# Patient Record
Sex: Male | Born: 1965 | Race: White | Hispanic: No | Marital: Married | Smoking: Never smoker
Health system: Southern US, Community
[De-identification: ages and names within clinical notes are randomized; demographics above are authoritative.]

## PROBLEM LIST (undated history)

## (undated) DIAGNOSIS — Z973 Presence of spectacles and contact lenses: Secondary | ICD-10-CM

## (undated) DIAGNOSIS — K5792 Diverticulitis of intestine, part unspecified, without perforation or abscess without bleeding: Secondary | ICD-10-CM

## (undated) DIAGNOSIS — H269 Unspecified cataract: Secondary | ICD-10-CM

## (undated) DIAGNOSIS — C801 Malignant (primary) neoplasm, unspecified: Secondary | ICD-10-CM

## (undated) DIAGNOSIS — K219 Gastro-esophageal reflux disease without esophagitis: Secondary | ICD-10-CM

## (undated) HISTORY — DX: Unspecified cataract: H26.9

## (undated) HISTORY — PX: TENDON REPAIR: SHX5111

## (undated) HISTORY — PX: HERNIA REPAIR: SHX51

---
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
## 1988-11-09 HISTORY — PX: TONSILLECTOMY AND ADENOIDECTOMY: SUR1326

## 2002-11-22 ENCOUNTER — Encounter: Admission: RE | Admit: 2002-11-22 | Discharge: 2002-11-22 | Payer: Self-pay | Admitting: Family Medicine

## 2002-11-22 ENCOUNTER — Encounter: Payer: Self-pay | Admitting: Family Medicine

## 2011-11-10 DIAGNOSIS — C801 Malignant (primary) neoplasm, unspecified: Secondary | ICD-10-CM

## 2011-11-10 HISTORY — DX: Malignant (primary) neoplasm, unspecified: C80.1

## 2011-11-10 HISTORY — PX: TESTICLE REMOVAL: SHX68

## 2013-10-26 DIAGNOSIS — E785 Hyperlipidemia, unspecified: Secondary | ICD-10-CM | POA: Insufficient documentation

## 2013-11-10 ENCOUNTER — Other Ambulatory Visit: Payer: Self-pay | Admitting: Urology

## 2013-11-13 ENCOUNTER — Encounter (HOSPITAL_BASED_OUTPATIENT_CLINIC_OR_DEPARTMENT_OTHER)
Admission: RE | Disposition: A | Discharge status: Home / Self Care | Payer: Self-pay | Source: Ambulatory Visit | Attending: Urology

## 2013-11-13 ENCOUNTER — Encounter (HOSPITAL_BASED_OUTPATIENT_CLINIC_OR_DEPARTMENT_OTHER): Payer: Self-pay | Admitting: *Deleted

## 2013-11-13 ENCOUNTER — Ambulatory Visit (HOSPITAL_BASED_OUTPATIENT_CLINIC_OR_DEPARTMENT_OTHER): Payer: Managed Care, Other (non HMO) | Admitting: Anesthesiology

## 2013-11-13 ENCOUNTER — Encounter (HOSPITAL_BASED_OUTPATIENT_CLINIC_OR_DEPARTMENT_OTHER): Payer: Managed Care, Other (non HMO) | Admitting: Anesthesiology

## 2013-11-13 ENCOUNTER — Ambulatory Visit (HOSPITAL_BASED_OUTPATIENT_CLINIC_OR_DEPARTMENT_OTHER)
Admission: RE | Admit: 2013-11-13 | Discharge: 2013-11-13 | Disposition: A | Discharge status: Home / Self Care | Payer: Managed Care, Other (non HMO) | Source: Ambulatory Visit | Attending: Urology | Admitting: Urology

## 2013-11-13 DIAGNOSIS — N508 Other specified disorders of male genital organs: Secondary | ICD-10-CM | POA: Insufficient documentation

## 2013-11-13 DIAGNOSIS — N5089 Other specified disorders of the male genital organs: Secondary | ICD-10-CM

## 2013-11-13 DIAGNOSIS — D409 Neoplasm of uncertain behavior of male genital organ, unspecified: Secondary | ICD-10-CM | POA: Insufficient documentation

## 2013-11-13 DIAGNOSIS — Z9852 Vasectomy status: Secondary | ICD-10-CM | POA: Insufficient documentation

## 2013-11-13 DIAGNOSIS — R05 Cough: Secondary | ICD-10-CM | POA: Insufficient documentation

## 2013-11-13 DIAGNOSIS — R059 Cough, unspecified: Secondary | ICD-10-CM | POA: Insufficient documentation

## 2013-11-13 HISTORY — DX: Presence of spectacles and contact lenses: Z97.3

## 2013-11-13 HISTORY — PX: ORCHIECTOMY: SHX2116

## 2013-11-13 LAB — POCT HEMOGLOBIN-HEMACUE: Hemoglobin: 17.2 g/dL — ABNORMAL HIGH (ref 13.0–17.0)

## 2013-11-13 SURGERY — ORCHIECTOMY
Anesthesia: General | Site: Scrotum | Laterality: Left

## 2013-11-13 MED ORDER — FENTANYL CITRATE 0.05 MG/ML IJ SOLN
INTRAMUSCULAR | Status: AC
  Filled 2013-11-13: qty 6

## 2013-11-13 MED ORDER — HYDROMORPHONE HCL PF 1 MG/ML IJ SOLN
0.2500 mg | INTRAMUSCULAR | Status: DC | PRN
  Administered 2013-11-13 (×2): 0.5 mg via INTRAVENOUS
  Filled 2013-11-13: qty 1

## 2013-11-13 MED ORDER — PROMETHAZINE HCL 25 MG/ML IJ SOLN
6.2500 mg | INTRAMUSCULAR | Status: DC | PRN
  Filled 2013-11-13: qty 1

## 2013-11-13 MED ORDER — BUPIVACAINE HCL (PF) 0.25 % IJ SOLN
INTRAMUSCULAR | Status: DC | PRN
  Administered 2013-11-13: 20 mL

## 2013-11-13 MED ORDER — HYDROMORPHONE HCL PF 1 MG/ML IJ SOLN
INTRAMUSCULAR | Status: AC
  Filled 2013-11-13: qty 1

## 2013-11-13 MED ORDER — LIDOCAINE HCL (CARDIAC) 20 MG/ML IV SOLN
INTRAVENOUS | Status: DC | PRN
Start: 2013-11-13 — End: 2013-11-13
  Administered 2013-11-13: 60 mg via INTRAVENOUS

## 2013-11-13 MED ORDER — PROPOFOL 10 MG/ML IV BOLUS
INTRAVENOUS | Status: DC | PRN
  Administered 2013-11-13: 200 mg via INTRAVENOUS

## 2013-11-13 MED ORDER — OXYCODONE HCL 5 MG/5ML PO SOLN
5.0000 mg | Freq: Once | ORAL | Status: DC | PRN
  Filled 2013-11-13: qty 5

## 2013-11-13 MED ORDER — OXYCODONE-ACETAMINOPHEN 5-325 MG PO TABS: 1.0000 | ORAL_TABLET | ORAL | Status: DC | PRN

## 2013-11-13 MED ORDER — MIDAZOLAM HCL 2 MG/2ML IJ SOLN
INTRAMUSCULAR | Status: AC
  Filled 2013-11-13: qty 2

## 2013-11-13 MED ORDER — MIDAZOLAM HCL 5 MG/5ML IJ SOLN
INTRAMUSCULAR | Status: DC | PRN
  Administered 2013-11-13: 2 mg via INTRAVENOUS

## 2013-11-13 MED ORDER — OXYCODONE-ACETAMINOPHEN 5-325 MG PO TABS
ORAL_TABLET | ORAL | Status: AC
  Filled 2013-11-13: qty 1

## 2013-11-13 MED ORDER — SENNOSIDES-DOCUSATE SODIUM 8.6-50 MG PO TABS: 1.0000 | ORAL_TABLET | Freq: Two times a day (BID) | ORAL | Status: DC

## 2013-11-13 MED ORDER — OXYCODONE HCL 5 MG PO TABS
5.0000 mg | ORAL_TABLET | Freq: Once | ORAL | Status: DC | PRN
  Filled 2013-11-13: qty 1

## 2013-11-13 MED ORDER — CEFAZOLIN SODIUM-DEXTROSE 2-3 GM-% IV SOLR
2.0000 g | INTRAVENOUS | Status: AC
  Administered 2013-11-13: 2 g via INTRAVENOUS
  Filled 2013-11-13: qty 50

## 2013-11-13 MED ORDER — DEXAMETHASONE SODIUM PHOSPHATE 4 MG/ML IJ SOLN
INTRAMUSCULAR | Status: DC | PRN
  Administered 2013-11-13: 8 mg via INTRAVENOUS

## 2013-11-13 MED ORDER — FENTANYL CITRATE 0.05 MG/ML IJ SOLN
INTRAMUSCULAR | Status: DC | PRN
  Administered 2013-11-13: 50 ug via INTRAVENOUS
  Administered 2013-11-13: 25 ug via INTRAVENOUS
  Administered 2013-11-13: 50 ug via INTRAVENOUS
  Administered 2013-11-13 (×3): 25 ug via INTRAVENOUS

## 2013-11-13 MED ORDER — ONDANSETRON HCL 4 MG/2ML IJ SOLN
INTRAMUSCULAR | Status: DC | PRN
  Administered 2013-11-13: 4 mg via INTRAVENOUS

## 2013-11-13 MED ORDER — LACTATED RINGERS IV SOLN
INTRAVENOUS | Status: DC
  Administered 2013-11-13 (×3): via INTRAVENOUS
  Filled 2013-11-13: qty 1000

## 2013-11-13 MED ORDER — OXYCODONE-ACETAMINOPHEN 5-325 MG PO TABS
1.0000 | ORAL_TABLET | ORAL | Status: DC | PRN
  Administered 2013-11-13: 1 via ORAL
  Filled 2013-11-13: qty 2

## 2013-11-13 MED ORDER — MEPERIDINE HCL 25 MG/ML IJ SOLN
6.2500 mg | INTRAMUSCULAR | Status: DC | PRN
  Filled 2013-11-13: qty 1

## 2013-11-13 MED ORDER — CEPHALEXIN 500 MG PO CAPS: 500.0000 mg | ORAL_CAPSULE | Freq: Three times a day (TID) | ORAL | Status: DC

## 2013-11-13 SURGICAL SUPPLY — 50 items
BANDAGE GAUZE ELAST BULKY 4 IN (GAUZE/BANDAGES/DRESSINGS) ×3 IMPLANT
BLADE HEX COATED 2.75 (ELECTRODE) ×6 IMPLANT
BLADE SURG 10 STRL SS (BLADE) ×3 IMPLANT
BLADE SURG 15 STRL LF DISP TIS (BLADE) ×1 IMPLANT
BLADE SURG 15 STRL SS (BLADE) ×2
BNDG GAUZE ELAST 4 BULKY (GAUZE/BANDAGES/DRESSINGS) ×3 IMPLANT
CANISTER SUCT LVC 12 LTR MEDI- (MISCELLANEOUS) IMPLANT
CANISTER SUCTION 2500CC (MISCELLANEOUS) ×3 IMPLANT
CLOTH BEACON ORANGE TIMEOUT ST (SAFETY) IMPLANT
COVER MAYO STAND STRL (DRAPES) ×3 IMPLANT
COVER TABLE BACK 60X90 (DRAPES) ×3 IMPLANT
DERMABOND ADVANCED (GAUZE/BANDAGES/DRESSINGS) ×2
DERMABOND ADVANCED .7 DNX12 (GAUZE/BANDAGES/DRESSINGS) ×1 IMPLANT
DISSECTOR ROUND CHERRY 3/8 STR (MISCELLANEOUS) ×3 IMPLANT
DRAIN PENROSE 18X1/4 LTX STRL (WOUND CARE) ×3 IMPLANT
DRAPE PED LAPAROTOMY (DRAPES) ×3 IMPLANT
ELECT REM PT RETURN 9FT ADLT (ELECTROSURGICAL) ×3
ELECTRODE REM PT RTRN 9FT ADLT (ELECTROSURGICAL) ×1 IMPLANT
GLOVE BIO SURGEON STRL SZ7.5 (GLOVE) ×3 IMPLANT
GLOVE BIOGEL PI IND STRL 7.5 (GLOVE) ×2 IMPLANT
GLOVE BIOGEL PI INDICATOR 7.5 (GLOVE) ×4
GLOVE ECLIPSE 7.5 STRL STRAW (GLOVE) ×3 IMPLANT
GLOVE INDICATOR 7.5 STRL GRN (GLOVE) ×6 IMPLANT
GOWN PREVENTION PLUS LG XLONG (DISPOSABLE) IMPLANT
GOWN PREVENTION PLUS XLARGE (GOWN DISPOSABLE) ×6 IMPLANT
NEEDLE HYPO 22GX1.5 SAFETY (NEEDLE) ×3 IMPLANT
NS IRRIG 500ML POUR BTL (IV SOLUTION) ×3 IMPLANT
PACK BASIN DAY SURGERY FS (CUSTOM PROCEDURE TRAY) ×3 IMPLANT
PENCIL BUTTON HOLSTER BLD 10FT (ELECTRODE) ×3 IMPLANT
SPONGE LAP 4X18 X RAY DECT (DISPOSABLE) IMPLANT
SUPPORT SCROTAL LG STRP (MISCELLANEOUS) ×2 IMPLANT
SUPPORTER ATHLETIC LG (MISCELLANEOUS) ×1
SUT CHROMIC 2 0 SH (SUTURE) ×3 IMPLANT
SUT CHROMIC 3 0 SH 27 (SUTURE) IMPLANT
SUT MNCRL AB 4-0 PS2 18 (SUTURE) ×3 IMPLANT
SUT SILK 0 SH 30 (SUTURE) ×3 IMPLANT
SUT SILK 0 TIES 10X30 (SUTURE) ×3 IMPLANT
SUT VIC AB 2-0 SH 27 (SUTURE) ×4
SUT VIC AB 2-0 SH 27XBRD (SUTURE) ×2 IMPLANT
SUT VIC AB 2-0 UR5 27 (SUTURE) IMPLANT
SUT VICRYL 0 TIES 12 18 (SUTURE) IMPLANT
SYR BULB IRRIGATION 50ML (SYRINGE) ×3 IMPLANT
SYR CONTROL 10ML LL (SYRINGE) ×3 IMPLANT
TOWEL OR 17X24 6PK STRL BLUE (TOWEL DISPOSABLE) ×9 IMPLANT
TRAY DSU PREP LF (CUSTOM PROCEDURE TRAY) ×3 IMPLANT
TUBE CONNECTING 12'X1/4 (SUCTIONS) ×1
TUBE CONNECTING 12X1/4 (SUCTIONS) ×2 IMPLANT
TUBING SUCTION BULK 100 FT (MISCELLANEOUS) IMPLANT
WATER STERILE IRR 500ML POUR (IV SOLUTION) IMPLANT
YANKAUER SUCT BULB TIP NO VENT (SUCTIONS) ×3 IMPLANT

## 2013-11-13 NOTE — Transfer of Care (Signed)
Immediate Anesthesia Transfer of Care Note  Patient: Roberto Ruiz  Procedure(s) Performed: Procedure(s) (LRB): LEFT RADICAL INGUINAL ORCHIECTOMY (Left)  Patient Location: PACU  Anesthesia Type: General  Level of Consciousness: awake, oriented, sedated and patient cooperative  Airway & Oxygen Therapy: Patient Spontanous Breathing and Patient connected to face mask oxygen  Post-op Assessment: Report given to PACU RN and Post -op Vital signs reviewed and stable  Post vital signs: Reviewed and stable  Complications: No apparent anesthesia complications

## 2013-11-13 NOTE — Op Note (Signed)
Urology Operative Report  Date of Procedure: 11/13/13  Surgeon: Rolan Bucco, MD Assistant:  None  Preoperative Diagnosis: Left testis mass Postoperative Diagnosis:  Same  Procedure(s): Left inguinal radical orchiectomy  Estimated blood loss: Minimal  Specimen: Left testis and spermatic cord.  Drains: None  Complications: None  Findings: Solid left testis mass.  History of present illness: Patient presents today for left inguinal radical orchiectomy for a solid mass discovered on routine physical exam of the left testicle.   Procedure in detail: After informed consent was obtained, the patient was taken to the operating room. They were placed in the supine position. SCDs were turned on and in place. IV antibiotics were infused, and general anesthesia was induced. A timeout was performed in which the correct patient, surgical site, and procedure were identified and agreed upon by the team.  The hair was removed from the genitals and lower abdomen and then these areas were prepped and draped in the usual sterile fashion.  I was able to place my finger along the spermatic cord and palpate the external ring of the left inguinal canal. I made a mark on the skin with a marking pen were this was located. I then made a horizontal incision proximal and 2 cm superior to this on the lower left abdomen. I dissected down through the layers of the abdominal wall using Bovie electrocautery to maintain good hemostasis until encountered external aponeurosis of the external oblique muscle. I was then able to make an incision running along the fibers of the aponeurosis. I then pushed close Metzenbaum scissors and the this towards the external ring and then again laterally and medially to push away the ilioinguinal nerve. I then incised down to the external ring. I was then able to identify the ileal inguinal nerve and displaced this tumor was out of the field of dissection to avoid injury.  I then  was able to place my fingers and thumb circumferentially around the left spermatic cord at the level of the pubic tubercle. I then placed a Penrose drain doubly wrapped around the spermatic cord and used this as a tourniquet. After this I was able to sweep away cremasteric fibers which were attaching the cord to the aponeurosis. After this was done I was able to deliver the testicle into the incision by pushing superiorly on the left hemiscrotum. I then dissected the testicle away from the scrotal wall. This took approximately twice as long as normal likely due to the fact that he had a previous vasectomy. There was an area on the scrotum where there was a large amount of adhesions. I was careful not to buttonhole the skin of the scrotum. Once the testicle was free I was able to inspect this and determined that there was no hernia sac or hernia present. I then separated the left spermatic cord into 4 separate bundles; one of these was the vas deferens. These were ligated with 0 silk ties and 2-0 silk sutures at the level of the inguinal canal. After this was done with each bundle, there was good hemostasis. I observed the left spermatic cord and there was no bleeding. There was not enough stump left of the spermatic cord to provide an adequate left spermatic cord nerve block and therefore this was not attempted. I left several the silk sutures long straight be easily found if intra-abdominal surgery was needed in the future. I then split the left spermatic cord stump into the inguinal canal.  I then closed aponeurosis of  the inguinal canal with interrupted 2-0 Vicryl sutures making sure to avoid injury to the ilioinguinal nerve. After this was done I turned to the scrotum inside out and inspected this for hemostasis. There was good hemostasis. I then irrigated with sterile normal saline.  I then closed Camper's and Scarpa's fascia with running and interrupted 2-0 Vicryl sutures. I then irrigated the wound again,  and injected 20 cc of quarter percent plain Marcaine around the incision. I then closed the skin with running 4-0 Monocryl suture and covered this with Dermabond. Scrotal support was placed on the scrotum, and the testicle and spermatic cord were sent for permanent pathology.  Anesthesia was reversed, he was taken to the [facility] in stable condition.  All counts were correct at the end of the case.

## 2013-11-13 NOTE — H&P (Signed)
Urology History and Physical Exam  CC: Left testis tumor  HPI:  48 year old male presents today for a left testis mass.  This was discovered during routine physical examination.  He had not noticed this previously.  It was not present one year ago on physical exam.  This is not associated with testis pain.  He states that 2 months ago he had an injury to the left testis.  Physical exam revealed a very firm mass in the left testis which was nontender to palpation.  This was 3 cm in size.  Concerning for testicular tumor.  Positive previous history of bilateral vasectomy.  Scrotal ultrasound 10/26/13 revealed a hypoechoic testis mass which was 2.7 cm in size with substantial blood flow.  This was concerning for tumor.  We discussed management options and I recommended left inguinal radical orchiectomy with left spermatic cord nerve block.  We discussed the risks, benefits, alternatives, likelihood of achieving goals, and side effects.  He had staging with CT of the chest abdomen and pelvis which was negative.  Preoperative blood tests reveal a normal AFP and LDH; beta hCG is pending at this time.  Preoperative testosterone was noted to be low at 275.  These results were provided to the patient.  He deferred placement of testicular prosthesis.  PMH: No past medical history on file.  PSH: No past surgical history on file.  Allergies: Allergies not on file  Medications: No prescriptions prior to admission     Social History: History   Social History  . Marital Status: Married    Spouse Name: N/A    Number of Children: N/A  . Years of Education: N/A   Occupational History  . Not on file.   Social History Main Topics  . Smoking status: Not on file  . Smokeless tobacco: Not on file  . Alcohol Use: Not on file  . Drug Use: Not on file  . Sexual Activity: Not on file   Other Topics Concern  . Not on file   Social History Narrative  . No narrative on file    Family History: No  family history on file.  Review of Systems: Positive: Cough. Negative: Fever, SOB, or chest pain.  A further 10 point review of systems was negative except what is listed in the HPI.  Physical Exam: Filed Vitals:   11/13/13 1101  BP: 139/88  Pulse: 77  Temp: 97.8 F (36.6 C)  Resp: 14    General: No acute distress.  Awake. Head:  Normocephalic.  Atraumatic. ENT:  EOMI.  Mucous membranes moist Neck:  Supple.  No lymphadenopathy. CV:  S1 present. S2 present. Regular rate. Pulmonary: Equal effort bilaterally.  Clear to auscultation bilaterally. Abdomen: Soft.  Non- tender to palpation. Skin:  Normal turgor.  No visible rash. Extremity: No gross deformity of bilateral upper extremities.  No gross deformity of    bilateral lower extremities. Neurologic: Alert. Appropriate mood.  GU:  Solid left testis mass. Right testis negative for mass.    Studies:  No results found for this basename: HGB, WBC, PLT,  in the last 72 hours  No results found for this basename: NA, K, CL, CO2, BUN, CREATININE, CALCIUM, MAGNESIUM, GFRNONAA, GFRAA,  in the last 72 hours   No results found for this basename: PT, INR, APTT,  in the last 72 hours   No components found with this basename: ABG,     Assessment:  Left testis tumor.  Plan: To OR for left inguinal radical  orchiectomy with left spermatic cord nerve block.

## 2013-11-13 NOTE — Discharge Instructions (Signed)
DISCHARGE INSTRUCTIONS FOR SCROTAL SURGERY  MEDICATIONS:  1. DO NOT RESUME YOUR ASPIRIN, IBUPROFEN, WARFARIN, OR OTHER BLOOD THINNER FOR 1 WEEK.  2. Resume all your other meds from home.  ACTIVITY 1. No heavy lifting >10 pounds for 4 weeks 2. No sexual activity for 4 weeks 3. No strenuous activity for 4 weeks 4. No driving while on narcotic pain medications 5. Drink plenty of water 6. Continue to walk at home - you can still get blood clots when you are at  home, so keep active, but don't over do it. 7. No straddle activity (such as riding a bike or a horse)for 4 weeks.  BATHING 1. You can shower 48 hours after surgery. Do not take baths or submerge your wound underwater.  SIGNS/SYMPTOMS TO CALL: 1. Please call us if you have a fever greater than 101.5, uncontrolled  nausea/vomiting, uncontrolled pain, dizziness, unable to urinate, enlarging or swelling scrotum, wound drainage, chest pain, shortness of breath, leg swelling, leg pain, or any other concerns  or questions.  You can reach [place] at [phone].    Post Anesthesia Home Care Instructions  Activity: Get plenty of rest for the remainder of the day. A responsible adult should stay with you for 24 hours following the procedure.  For the next 24 hours, DO NOT: -Drive a car -Paediatric nurse -Drink alcoholic beverages -Take any medication unless instructed by your physician -Make any legal decisions or sign important papers.  Meals: Start with liquid foods such as gelatin or soup. Progress to regular foods as tolerated. Avoid greasy, spicy, heavy foods. If nausea and/or vomiting occur, drink only clear liquids until the nausea and/or vomiting subsides. Call your physician if vomiting continues.  Special Instructions/Symptoms: Your throat may feel dry or sore from the anesthesia or the breathing tube placed in your throat during surgery. If this causes discomfort, gargle with warm salt water. The discomfort should  disappear within 24 hours.

## 2013-11-13 NOTE — Anesthesia Procedure Notes (Signed)
Procedure Name: LMA Insertion Date/Time: 11/13/2013 1:15 PM Performed by: Denna Haggard D Pre-anesthesia Checklist: Patient identified, Emergency Drugs available, Suction available and Patient being monitored Patient Re-evaluated:Patient Re-evaluated prior to inductionOxygen Delivery Method: Circle System Utilized Preoxygenation: Pre-oxygenation with 100% oxygen Intubation Type: IV induction Ventilation: Mask ventilation without difficulty LMA: LMA inserted LMA Size: 4.0 Number of attempts: 1 Airway Equipment and Method: bite block Placement Confirmation: positive ETCO2 Tube secured with: Tape Dental Injury: Teeth and Oropharynx as per pre-operative assessment

## 2013-11-13 NOTE — Anesthesia Postprocedure Evaluation (Signed)
Anesthesia Post Note  Patient: Roberto Ruiz  Procedure(s) Performed: Procedure(s) (LRB): LEFT RADICAL INGUINAL ORCHIECTOMY (Left)  Anesthesia type: General  Patient location: PACU  Post pain: Pain level controlled  Post assessment: Post-op Vital signs reviewed  Last Vitals: BP 115/71  Pulse 67  Temp(Src) 36.6 C (Oral)  Resp 10  Ht 5\' 9"  (1.753 m)  Wt 223 lb 8 oz (101.379 kg)  BMI 32.99 kg/m2  SpO2 96%  Post vital signs: Reviewed  Level of consciousness: sedated  Complications: No apparent anesthesia complications

## 2013-11-13 NOTE — Anesthesia Preprocedure Evaluation (Signed)
Anesthesia Evaluation  Patient identified by MRN, date of birth, ID band Patient awake    Reviewed: Allergy & Precautions, H&P , NPO status , Patient's Chart, lab work & pertinent test results  History of Anesthesia Complications Negative for: history of anesthetic complications  Airway Mallampati: II TM Distance: >3 FB Neck ROM: full    Dental no notable dental hx. (+) Teeth Intact   Pulmonary neg pulmonary ROS,  breath sounds clear to auscultation  Pulmonary exam normal       Cardiovascular negative cardio ROS  Rhythm:regular Rate:Normal     Neuro/Psych negative neurological ROS  negative psych ROS   GI/Hepatic negative GI ROS, Neg liver ROS,   Endo/Other  negative endocrine ROS  Renal/GU negative Renal ROS  negative genitourinary   Musculoskeletal   Abdominal Normal abdominal exam  (+)   Peds  Hematology negative hematology ROS (+)   Anesthesia Other Findings   Reproductive/Obstetrics                           Anesthesia Physical Anesthesia Plan  ASA: I  Anesthesia Plan: General   Post-op Pain Management:    Induction:   Airway Management Planned:   Additional Equipment:   Intra-op Plan:   Post-operative Plan:   Informed Consent: I have reviewed the patients History and Physical, chart, labs and discussed the procedure including the risks, benefits and alternatives for the proposed anesthesia with the patient or authorized representative who has indicated his/her understanding and acceptance.   Dental Advisory Given  Plan Discussed with:   Anesthesia Plan Comments:         Anesthesia Quick Evaluation

## 2013-11-14 ENCOUNTER — Encounter (HOSPITAL_BASED_OUTPATIENT_CLINIC_OR_DEPARTMENT_OTHER): Payer: Self-pay | Admitting: Urology

## 2013-11-20 ENCOUNTER — Telehealth: Payer: Self-pay | Admitting: Oncology

## 2013-11-20 NOTE — Telephone Encounter (Signed)
S/w pt and gve np appt 01/15 @ 1:30 w/Dr. Alen Blew Referring Dr. Jasmine December Dx- Testicular Ca Welcome packet mailed.

## 2013-11-20 NOTE — Telephone Encounter (Signed)
C/D 11/20/13 for appt. 11/23/13

## 2013-11-20 NOTE — Telephone Encounter (Signed)
LVOM FOR PT TO RETURN CALL IN RE TO REFERRAL.  °

## 2013-11-21 ENCOUNTER — Other Ambulatory Visit: Payer: Self-pay | Admitting: Oncology

## 2013-11-21 DIAGNOSIS — C6290 Malignant neoplasm of unspecified testis, unspecified whether descended or undescended: Secondary | ICD-10-CM

## 2013-11-23 ENCOUNTER — Other Ambulatory Visit (HOSPITAL_BASED_OUTPATIENT_CLINIC_OR_DEPARTMENT_OTHER): Payer: Managed Care, Other (non HMO)

## 2013-11-23 ENCOUNTER — Encounter: Payer: Self-pay | Admitting: Oncology

## 2013-11-23 ENCOUNTER — Ambulatory Visit (HOSPITAL_BASED_OUTPATIENT_CLINIC_OR_DEPARTMENT_OTHER): Payer: Managed Care, Other (non HMO) | Admitting: Oncology

## 2013-11-23 ENCOUNTER — Telehealth: Payer: Self-pay | Admitting: Oncology

## 2013-11-23 ENCOUNTER — Ambulatory Visit: Payer: Managed Care, Other (non HMO)

## 2013-11-23 VITALS — BP 128/85 | HR 64 | Temp 98.1°F | Resp 18 | Ht 69.0 in | Wt 225.7 lb

## 2013-11-23 DIAGNOSIS — Z8547 Personal history of malignant neoplasm of testis: Secondary | ICD-10-CM | POA: Insufficient documentation

## 2013-11-23 DIAGNOSIS — C6290 Malignant neoplasm of unspecified testis, unspecified whether descended or undescended: Secondary | ICD-10-CM

## 2013-11-23 LAB — COMPREHENSIVE METABOLIC PANEL (CC13)
ALT: 31 U/L (ref 0–55)
ANION GAP: 12 meq/L — AB (ref 3–11)
AST: 21 U/L (ref 5–34)
Albumin: 4.2 g/dL (ref 3.5–5.0)
Alkaline Phosphatase: 74 U/L (ref 40–150)
BUN: 17.2 mg/dL (ref 7.0–26.0)
CALCIUM: 9.8 mg/dL (ref 8.4–10.4)
CHLORIDE: 105 meq/L (ref 98–109)
CO2: 25 meq/L (ref 22–29)
CREATININE: 1.5 mg/dL — AB (ref 0.7–1.3)
GLUCOSE: 96 mg/dL (ref 70–140)
Potassium: 4 mEq/L (ref 3.5–5.1)
Sodium: 142 mEq/L (ref 136–145)
Total Bilirubin: 0.61 mg/dL (ref 0.20–1.20)
Total Protein: 8 g/dL (ref 6.4–8.3)

## 2013-11-23 LAB — CBC WITH DIFFERENTIAL/PLATELET
BASO%: 0.8 % (ref 0.0–2.0)
BASOS ABS: 0.1 10*3/uL (ref 0.0–0.1)
EOS ABS: 0.1 10*3/uL (ref 0.0–0.5)
EOS%: 0.9 % (ref 0.0–7.0)
HEMATOCRIT: 42.9 % (ref 38.4–49.9)
HEMOGLOBIN: 14.3 g/dL (ref 13.0–17.1)
LYMPH#: 1.8 10*3/uL (ref 0.9–3.3)
LYMPH%: 24.4 % (ref 14.0–49.0)
MCH: 28.9 pg (ref 27.2–33.4)
MCHC: 33.4 g/dL (ref 32.0–36.0)
MCV: 86.4 fL (ref 79.3–98.0)
MONO#: 0.5 10*3/uL (ref 0.1–0.9)
MONO%: 7.1 % (ref 0.0–14.0)
NEUT%: 66.8 % (ref 39.0–75.0)
NEUTROS ABS: 4.9 10*3/uL (ref 1.5–6.5)
Platelets: 284 10*3/uL (ref 140–400)
RBC: 4.97 10*6/uL (ref 4.20–5.82)
RDW: 13.2 % (ref 11.0–14.6)
WBC: 7.3 10*3/uL (ref 4.0–10.3)

## 2013-11-23 LAB — LACTATE DEHYDROGENASE (CC13): LDH: 215 U/L (ref 125–245)

## 2013-11-23 MED ORDER — PROCHLORPERAZINE MALEATE 10 MG PO TABS: 10.0000 mg | ORAL_TABLET | Freq: Four times a day (QID) | ORAL | Status: DC | PRN

## 2013-11-23 NOTE — Telephone Encounter (Signed)
gv pt appt schedule for january thru march including appt for pft @ [facility] 1/20.

## 2013-11-23 NOTE — Consult Note (Signed)
Reason for Referral: Testis cancer.   HPI: This is a pleasant 48 year old gentleman without any significant past medical history referred to me for the evaluation of testicular cancer. He was in his usual state of health after he sustained trauma to his left groin after being hit by a soccer ball. He continued to have persistent pain in an area and subsequently underwent an ultrasound which showed a left testicular mass suspicious for malignancy. He was referred to Dr. Jasmine December and had tumor markers done which showed a normal LDH, normal alpha-fetoprotein at 3.3 and a normal beta hCG at 2.4 those were done on 11/10/2013. On 11/13/2013 he underwent a left inguinal radical orchiectomy which she tolerated very well. The pathology revealed a mixture muscle tumor measuring 2.5 cm. The margins were negative but the tumor showed 60% seminoma and 40% embryonal carcinoma. He did have lymphovascular invasion and the pathological staging was T2 NX. CT scan of the chest abdomen and pelvis did not show any evidence of advanced malignancy. His lymph nodes did not show any other malignancy. Patient is recovering quite nicely without any symptoms. Is not reporting any abdominal pain or discomfort. Has not reported any discharge or dysuria. Has not reported any other masses or lesions.   Past Medical History  Diagnosis Date  . Wears contact lenses   :  Past Surgical History  Procedure Laterality Date  . Tonsillectomy and adenoidectomy  1990  . Right arthroscopy w/ open removal of reputured bursa  1987  . Orchiectomy Left 11/13/2013    Procedure: LEFT RADICAL INGUINAL ORCHIECTOMY;  Surgeon: Molli Hazard, MD;  Location: [facility];  Service: Urology;  Laterality: Left;  : Current Outpatient Prescriptions  Medication Sig Dispense Refill  . prochlorperazine (COMPAZINE) 10 MG tablet Take 1 tablet (10 mg total) by mouth every 6 (six) hours as needed for nausea or vomiting.  30 tablet  0   No  current facility-administered medications for this visit.      Allergies  Allergen Reactions  . Sulfa Antibiotics Hives  :  No family history on file.:  History   Social History  . Marital Status: Married    Spouse Name: N/A    Number of Children: N/A  . Years of Education: N/A   Occupational History  . Not on file.   Social History Main Topics  . Smoking status: Never Smoker   . Smokeless tobacco: Never Used  . Alcohol Use: No  . Drug Use: No  . Sexual Activity: Not on file   Other Topics Concern  . Not on file   Social History Narrative  . No narrative on file  :  Constitutional: negative for anorexia, chills and fatigue Eyes: negative for icterus, irritation and redness Ears, nose, mouth, throat, and face: negative for sore mouth, sore throat and voice change Respiratory: negative for cough, dyspnea on exertion, hemoptysis and wheezing Cardiovascular: negative for chest pain, chest pressure/discomfort, exertional chest pressure/discomfort, irregular heart beat and lower extremity edema Gastrointestinal: negative for abdominal pain, diarrhea and nausea Genitourinary:negative for genital lesions and penile discharge Integument/breast: negative for pruritus, rash and skin color change Hematologic/lymphatic: negative for bleeding, easy bruising and lymphadenopathy Musculoskeletal:negative for arthralgias, back pain and muscle weakness Neurological: negative for coordination problems, dizziness and gait problems Behavioral/Psych: negative for depression and fatigue Endocrine: negative for fertility problems and temperature intolerance Allergic/Immunologic: negative for urticaria  Exam: Blood pressure 128/85, pulse 64, temperature 98.1 F (36.7 C), temperature source Oral, resp. rate 18, height  5\' 9"  (1.753 m), weight 225 lb 11.2 oz (102.377 kg). General appearance: alert, cooperative and appears stated age Head: Normocephalic, without obvious abnormality,  atraumatic Nose: Nares normal. Septum midline. Mucosa normal. No drainage or sinus tenderness. Throat: lips, mucosa, and tongue normal; teeth and gums normal Neck: no adenopathy, no carotid bruit, no JVD, supple, symmetrical, trachea midline and thyroid not enlarged, symmetric, no tenderness/mass/nodules Back: negative Resp: clear to auscultation bilaterally Chest wall: no tenderness Cardio: regular rate and rhythm, S1, S2 normal, no murmur, click, rub or gallop GI: soft, non-tender; bowel sounds normal; no masses,  no organomegaly Extremities: extremities normal, atraumatic, no cyanosis or edema Pulses: 2+ and symmetric Skin: Skin color, texture, turgor normal. No rashes or lesions Lymph nodes: Cervical, supraclavicular, and axillary nodes normal. 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 The pathology showed mixed germ cell tumor with 60% seminoma and 40% embryonal with lymphovascular invasion and a negative CT scan with clinical stage T2 N0. The natural course of this disease was discussed today in detail with the patient and his wife. I've explained to him that without any further therapy he has probably 60-70% chance of never seen this cancer again the flip side clearly that risk could be 30-40% chance of developing metastatic disease. His risk is exacerbated by lymphovascular invasion as well as the embryonal component. The fact that his embryonal component is not the predominant pathology puts him at less than 50% relapse rate. As as treatment at this point were discussed in detail. The options of a retroperitoneal lymph node  dissection versus observation versus adjuvant chemotherapy were discussed. Surgery was discussed today briefly and I reiterated what Dr. Jasmine December have explained to him and the risks and complications associated with the surgery. He declined this option at this time. The rest of the discussion today was focused on discussing observation versus adjuvant chemotherapy. I explained to him regardless of the choice of treatment he still have over 90% chance of cure rate. The logistics of administration chemotherapy was discussed today in detail. I explained to him that we'll utilize 1-2 cycles of BEP chemotherapy. Complications associated with chemotherapy this chemotherapy were discussed which include nausea, vomiting, GI toxicity, myelosuppression, neutropenia, neutropenic sepsis possible laser hospitalization and intravenous antibiotics. Other complications including renal toxicity renal failure and electrolyte imbalance were also outlined today in detail. Complication from bleomycin that include infusion-related toxicity, fever as well as pulmonary toxicity which is very rare but very serious lung complication. Fertility issues were addressed briefly but did not desire any further children at this time so less of an issue. Secondary malignancy was also discussed at this time. After weighing the risks and benefits of all these approaches including observation and surveillance patient have elected to proceed with systemic chemotherapy which will be started in the near future. I will obtain pulmonary function tests as well as serum of with chemotherapy education class before the start of chemotherapy.  2. Nausea prophylaxis: Given a prescription for anti-emetics today.  3. Smoking cessation: He is not an active smoker at this time.

## 2013-11-23 NOTE — Progress Notes (Signed)
Checked in new patient with no financial issues. He has appt card and has not been to [place]. ° °

## 2013-11-23 NOTE — Progress Notes (Signed)
Please see consult note.  

## 2013-11-26 LAB — AFP TUMOR MARKER: AFP-Tumor Marker: 2.6 ng/mL (ref 0.0–8.0)

## 2013-11-26 LAB — BETA HCG QUANT (REF LAB): Beta hCG, Tumor Marker: <2 m[IU]/mL (ref <5.0)

## 2013-11-28 ENCOUNTER — Ambulatory Visit (HOSPITAL_COMMUNITY)
Admission: RE | Admit: 2013-11-28 | Discharge: 2013-11-28 | Disposition: A | Discharge status: Home / Self Care | Payer: Managed Care, Other (non HMO) | Source: Ambulatory Visit | Attending: Oncology | Admitting: Oncology

## 2013-11-28 ENCOUNTER — Encounter: Payer: Self-pay | Admitting: *Deleted

## 2013-11-28 ENCOUNTER — Other Ambulatory Visit: Payer: Managed Care, Other (non HMO)

## 2013-11-28 DIAGNOSIS — C6290 Malignant neoplasm of unspecified testis, unspecified whether descended or undescended: Secondary | ICD-10-CM

## 2013-11-28 DIAGNOSIS — R0989 Other specified symptoms and signs involving the circulatory and respiratory systems: Secondary | ICD-10-CM | POA: Insufficient documentation

## 2013-11-28 DIAGNOSIS — Z01818 Encounter for other preprocedural examination: Secondary | ICD-10-CM | POA: Insufficient documentation

## 2013-11-28 DIAGNOSIS — R0609 Other forms of dyspnea: Secondary | ICD-10-CM | POA: Insufficient documentation

## 2013-11-28 LAB — PULMONARY FUNCTION TEST
DL/VA % pred: 89 %
DL/VA: 4.11 ml/min/mmHg/L
DLCO COR: 29.55 ml/min/mmHg
DLCO cor % pred: 95 %
DLCO unc % pred: 94 %
DLCO unc: 29.3 ml/min/mmHg
FEF 25-75 Pre: 4.43 L/sec
FEF2575-%Pred-Pre: 122 %
FEV1-%PRED-PRE: 108 %
FEV1-Pre: 4.28 L
FEV1FVC-%Pred-Pre: 102 %
FEV6-%Pred-Pre: 108 %
FEV6-Pre: 5.27 L
FEV6FVC-%Pred-Pre: 103 %
FVC-%Pred-Pre: 105 %
FVC-Pre: 5.27 L
PRE FEV1/FVC RATIO: 81 %
Pre FEV6/FVC Ratio: 100 %

## 2013-11-30 ENCOUNTER — Telehealth: Payer: Self-pay | Admitting: Medical Oncology

## 2013-11-30 NOTE — Telephone Encounter (Signed)
Patient called stating has fever of 101.5 this morning and concerned whether this will interfere with the start of his treatment on 12/18/13. Advised patient to see his PCP, patient stated he was there now, and informed patient that if he is symptom free by the start of the first treatment, there should not be a delay. Encouraged patient to call clinic with any questions or concerns, patient gave verbal understanding. Denies further questions at this time.

## 2013-12-18 ENCOUNTER — Other Ambulatory Visit (HOSPITAL_BASED_OUTPATIENT_CLINIC_OR_DEPARTMENT_OTHER): Payer: Managed Care, Other (non HMO)

## 2013-12-18 ENCOUNTER — Ambulatory Visit (HOSPITAL_BASED_OUTPATIENT_CLINIC_OR_DEPARTMENT_OTHER): Payer: Managed Care, Other (non HMO)

## 2013-12-18 ENCOUNTER — Encounter (INDEPENDENT_AMBULATORY_CARE_PROVIDER_SITE_OTHER): Payer: Self-pay

## 2013-12-18 VITALS — BP 119/85 | HR 65 | Temp 98.5°F | Resp 20

## 2013-12-18 DIAGNOSIS — C6290 Malignant neoplasm of unspecified testis, unspecified whether descended or undescended: Secondary | ICD-10-CM

## 2013-12-18 DIAGNOSIS — Z5111 Encounter for antineoplastic chemotherapy: Secondary | ICD-10-CM

## 2013-12-18 LAB — CBC WITH DIFFERENTIAL/PLATELET
BASO%: 0.5 % (ref 0.0–2.0)
Basophils Absolute: 0 10*3/uL (ref 0.0–0.1)
EOS%: 1.4 % (ref 0.0–7.0)
Eosinophils Absolute: 0.1 10*3/uL (ref 0.0–0.5)
HCT: 41.2 % (ref 38.4–49.9)
HGB: 13.9 g/dL (ref 13.0–17.1)
LYMPH#: 1.5 10*3/uL (ref 0.9–3.3)
LYMPH%: 25.5 % (ref 14.0–49.0)
MCH: 28.8 pg (ref 27.2–33.4)
MCHC: 33.7 g/dL (ref 32.0–36.0)
MCV: 85.5 fL (ref 79.3–98.0)
MONO#: 0.4 10*3/uL (ref 0.1–0.9)
MONO%: 6.5 % (ref 0.0–14.0)
NEUT#: 3.9 10*3/uL (ref 1.5–6.5)
NEUT%: 66.1 % (ref 39.0–75.0)
NRBC: 0 % (ref 0–0)
Platelets: 153 10*3/uL (ref 140–400)
RBC: 4.82 10*6/uL (ref 4.20–5.82)
RDW: 13.6 % (ref 11.0–14.6)
WBC: 5.9 10*3/uL (ref 4.0–10.3)

## 2013-12-18 LAB — COMPREHENSIVE METABOLIC PANEL (CC13)
ALT: 42 U/L (ref 0–55)
AST: 34 U/L (ref 5–34)
Albumin: 4.2 g/dL (ref 3.5–5.0)
Alkaline Phosphatase: 59 U/L (ref 40–150)
Anion Gap: 9 mEq/L (ref 3–11)
BUN: 11.7 mg/dL (ref 7.0–26.0)
CALCIUM: 9.8 mg/dL (ref 8.4–10.4)
CHLORIDE: 109 meq/L (ref 98–109)
CO2: 25 mEq/L (ref 22–29)
CREATININE: 1.1 mg/dL (ref 0.7–1.3)
Glucose: 80 mg/dl (ref 70–140)
Potassium: 4 mEq/L (ref 3.5–5.1)
Sodium: 143 mEq/L (ref 136–145)
Total Bilirubin: 0.69 mg/dL (ref 0.20–1.20)
Total Protein: 7.2 g/dL (ref 6.4–8.3)

## 2013-12-18 MED ORDER — SODIUM CHLORIDE 0.9 % IV SOLN
150.0000 mg | Freq: Once | INTRAVENOUS | Status: AC
  Administered 2013-12-18: 150 mg via INTRAVENOUS
  Filled 2013-12-18: qty 5

## 2013-12-18 MED ORDER — SODIUM CHLORIDE 0.9 % IV SOLN
100.0000 mg/m2 | Freq: Once | INTRAVENOUS | Status: AC
  Administered 2013-12-18: 220 mg via INTRAVENOUS
  Filled 2013-12-18: qty 11

## 2013-12-18 MED ORDER — POTASSIUM CHLORIDE 2 MEQ/ML IV SOLN
Freq: Once | INTRAVENOUS | Status: AC
  Administered 2013-12-18: 09:00:00 via INTRAVENOUS
  Filled 2013-12-18: qty 10

## 2013-12-18 MED ORDER — SODIUM CHLORIDE 0.9 % IV SOLN
Freq: Once | INTRAVENOUS | Status: AC
  Administered 2013-12-18: 09:00:00 via INTRAVENOUS

## 2013-12-18 MED ORDER — DEXAMETHASONE SODIUM PHOSPHATE 20 MG/5ML IJ SOLN
INTRAMUSCULAR | Status: AC
  Filled 2013-12-18: qty 5

## 2013-12-18 MED ORDER — CISPLATIN CHEMO INJECTION 100MG/100ML
20.0000 mg/m2 | Freq: Once | INTRAVENOUS | Status: AC
  Administered 2013-12-18: 45 mg via INTRAVENOUS
  Filled 2013-12-18: qty 45

## 2013-12-18 MED ORDER — PALONOSETRON HCL INJECTION 0.25 MG/5ML
INTRAVENOUS | Status: AC
  Filled 2013-12-18: qty 5

## 2013-12-18 MED ORDER — PALONOSETRON HCL INJECTION 0.25 MG/5ML
0.2500 mg | Freq: Once | INTRAVENOUS | Status: AC
  Administered 2013-12-18: 0.25 mg via INTRAVENOUS

## 2013-12-18 MED ORDER — DEXAMETHASONE SODIUM PHOSPHATE 20 MG/5ML IJ SOLN
12.0000 mg | Freq: Once | INTRAMUSCULAR | Status: AC
  Administered 2013-12-18: 11:00:00 via INTRAVENOUS

## 2013-12-18 NOTE — Patient Instructions (Signed)
[place] Discharge Instructions for Patients Receiving Chemotherapy  Today you received the following chemotherapy agents Cisplatin/Etoposide.  To help prevent nausea and vomiting after your treatment, we encourage you to take your nausea medication as directed. Take Compazine tonight with dinner or at bedtime.   If you develop nausea and vomiting that is not controlled by your nausea medication, call the clinic.   BELOW ARE SYMPTOMS THAT SHOULD BE REPORTED IMMEDIATELY:  *FEVER GREATER THAN 100.5 F  *CHILLS WITH OR WITHOUT FEVER  NAUSEA AND VOMITING THAT IS NOT CONTROLLED WITH YOUR NAUSEA MEDICATION  *UNUSUAL SHORTNESS OF BREATH  *UNUSUAL BRUISING OR BLEEDING  TENDERNESS IN MOUTH AND THROAT WITH OR WITHOUT PRESENCE OF ULCERS  *URINARY PROBLEMS  *BOWEL PROBLEMS  UNUSUAL RASH Items with * indicate a potential emergency and should be followed up as soon as possible.  Feel free to call the clinic you have any questions or concerns. The clinic phone number is [phone].

## 2013-12-19 ENCOUNTER — Telehealth: Payer: Self-pay | Admitting: *Deleted

## 2013-12-19 ENCOUNTER — Ambulatory Visit (HOSPITAL_BASED_OUTPATIENT_CLINIC_OR_DEPARTMENT_OTHER): Payer: Managed Care, Other (non HMO)

## 2013-12-19 VITALS — BP 120/67 | HR 83 | Temp 97.6°F | Resp 18

## 2013-12-19 DIAGNOSIS — C6290 Malignant neoplasm of unspecified testis, unspecified whether descended or undescended: Secondary | ICD-10-CM

## 2013-12-19 DIAGNOSIS — Z5111 Encounter for antineoplastic chemotherapy: Secondary | ICD-10-CM

## 2013-12-19 MED ORDER — POTASSIUM CHLORIDE 2 MEQ/ML IV SOLN
Freq: Once | INTRAVENOUS | Status: AC
  Administered 2013-12-19: 08:00:00 via INTRAVENOUS
  Filled 2013-12-19: qty 10

## 2013-12-19 MED ORDER — SODIUM CHLORIDE 0.9 % IV SOLN
100.0000 mg/m2 | Freq: Once | INTRAVENOUS | Status: AC
  Administered 2013-12-19: 220 mg via INTRAVENOUS
  Filled 2013-12-19: qty 11

## 2013-12-19 MED ORDER — DEXAMETHASONE SODIUM PHOSPHATE 20 MG/5ML IJ SOLN
INTRAMUSCULAR | Status: AC
  Filled 2013-12-19: qty 5

## 2013-12-19 MED ORDER — SODIUM CHLORIDE 0.9 % IV SOLN
20.0000 mg/m2 | Freq: Once | INTRAVENOUS | Status: AC
  Administered 2013-12-19: 45 mg via INTRAVENOUS
  Filled 2013-12-19: qty 45

## 2013-12-19 MED ORDER — SODIUM CHLORIDE 0.9 % IV SOLN
Freq: Once | INTRAVENOUS | Status: AC
  Administered 2013-12-19: 08:00:00 via INTRAVENOUS

## 2013-12-19 MED ORDER — SODIUM CHLORIDE 0.9 % IV SOLN
30.0000 [IU] | Freq: Once | INTRAVENOUS | Status: AC
  Administered 2013-12-19: 30 [IU] via INTRAVENOUS
  Filled 2013-12-19: qty 10

## 2013-12-19 MED ORDER — DEXAMETHASONE SODIUM PHOSPHATE 20 MG/5ML IJ SOLN
20.0000 mg | Freq: Once | INTRAMUSCULAR | Status: AC
Start: 2013-12-19 — End: 2013-12-19
  Administered 2013-12-19: 20 mg via INTRAVENOUS

## 2013-12-19 NOTE — Patient Instructions (Signed)
[place] Discharge Instructions for Patients Receiving Chemotherapy  Today you received the following chemotherapy agents Cisplatin, Etoposide and Bleomycin.   To help prevent nausea and vomiting after your treatment, we encourage you to take your nausea medication.   If you develop nausea and vomiting that is not controlled by your nausea medication, call the clinic.   BELOW ARE SYMPTOMS THAT SHOULD BE REPORTED IMMEDIATELY:  *FEVER GREATER THAN 100.5 F  *CHILLS WITH OR WITHOUT FEVER  NAUSEA AND VOMITING THAT IS NOT CONTROLLED WITH YOUR NAUSEA MEDICATION  *UNUSUAL SHORTNESS OF BREATH  *UNUSUAL BRUISING OR BLEEDING  TENDERNESS IN MOUTH AND THROAT WITH OR WITHOUT PRESENCE OF ULCERS  *URINARY PROBLEMS  *BOWEL PROBLEMS  UNUSUAL RASH Items with * indicate a potential emergency and should be followed up as soon as possible.  Feel free to call the clinic you have any questions or concerns. The clinic phone number is [phone].

## 2013-12-19 NOTE — Telephone Encounter (Signed)
Per staff message, sheet and desk RN ok to move appts from 2/24 to 2/23

## 2013-12-20 ENCOUNTER — Ambulatory Visit (HOSPITAL_BASED_OUTPATIENT_CLINIC_OR_DEPARTMENT_OTHER): Payer: Managed Care, Other (non HMO)

## 2013-12-20 ENCOUNTER — Encounter (INDEPENDENT_AMBULATORY_CARE_PROVIDER_SITE_OTHER): Payer: Self-pay

## 2013-12-20 VITALS — BP 141/73 | HR 60 | Temp 98.1°F | Resp 18

## 2013-12-20 DIAGNOSIS — C6290 Malignant neoplasm of unspecified testis, unspecified whether descended or undescended: Secondary | ICD-10-CM

## 2013-12-20 DIAGNOSIS — Z5111 Encounter for antineoplastic chemotherapy: Secondary | ICD-10-CM

## 2013-12-20 MED ORDER — DEXAMETHASONE SODIUM PHOSPHATE 20 MG/5ML IJ SOLN
INTRAMUSCULAR | Status: AC
  Filled 2013-12-20: qty 5

## 2013-12-20 MED ORDER — DEXAMETHASONE SODIUM PHOSPHATE 20 MG/5ML IJ SOLN
20.0000 mg | Freq: Once | INTRAMUSCULAR | Status: AC
  Administered 2013-12-20: 20 mg via INTRAVENOUS

## 2013-12-20 MED ORDER — PALONOSETRON HCL INJECTION 0.25 MG/5ML
INTRAVENOUS | Status: AC
  Filled 2013-12-20: qty 5

## 2013-12-20 MED ORDER — SODIUM CHLORIDE 0.9 % IV SOLN
Freq: Once | INTRAVENOUS | Status: AC
  Administered 2013-12-20: 10:00:00 via INTRAVENOUS

## 2013-12-20 MED ORDER — DEXTROSE-NACL 5-0.45 % IV SOLN
Freq: Once | INTRAVENOUS | Status: AC
  Administered 2013-12-20: 10:00:00 via INTRAVENOUS
  Filled 2013-12-20: qty 10

## 2013-12-20 MED ORDER — SODIUM CHLORIDE 0.9 % IV SOLN
100.0000 mg/m2 | Freq: Once | INTRAVENOUS | Status: AC
  Administered 2013-12-20: 220 mg via INTRAVENOUS
  Filled 2013-12-20: qty 11

## 2013-12-20 MED ORDER — PALONOSETRON HCL INJECTION 0.25 MG/5ML
0.2500 mg | Freq: Once | INTRAVENOUS | Status: AC
  Administered 2013-12-20: 0.25 mg via INTRAVENOUS

## 2013-12-20 MED ORDER — SODIUM CHLORIDE 0.9 % IV SOLN
20.0000 mg/m2 | Freq: Once | INTRAVENOUS | Status: AC
  Administered 2013-12-20: 45 mg via INTRAVENOUS
  Filled 2013-12-20: qty 45

## 2013-12-20 NOTE — Patient Instructions (Addendum)
[facility] Discharge Instructions for Patients Receiving Chemotherapy  Today you received the following chemotherapy agents: Cisplatin and Etoposide.   To help prevent nausea and vomiting after your treatment, we encourage you to take your nausea medication, Compazine. Take one every six hours as needed.    If you develop nausea and vomiting that is not controlled by your nausea medication, call the clinic.   BELOW ARE SYMPTOMS THAT SHOULD BE REPORTED IMMEDIATELY:  *FEVER GREATER THAN 100.5 F  *CHILLS WITH OR WITHOUT FEVER  NAUSEA AND VOMITING THAT IS NOT CONTROLLED WITH YOUR NAUSEA MEDICATION  *UNUSUAL SHORTNESS OF BREATH  *UNUSUAL BRUISING OR BLEEDING  TENDERNESS IN MOUTH AND THROAT WITH OR WITHOUT PRESENCE OF ULCERS  *URINARY PROBLEMS  *BOWEL PROBLEMS  UNUSUAL RASH Items with * indicate a potential emergency and should be followed up as soon as possible.  Feel free to call the clinic should you have any questions or concerns. The clinic phone number is [phone]. It was a pleasure taking care of you today!

## 2013-12-21 ENCOUNTER — Ambulatory Visit (HOSPITAL_BASED_OUTPATIENT_CLINIC_OR_DEPARTMENT_OTHER): Payer: Managed Care, Other (non HMO)

## 2013-12-21 VITALS — BP 128/76 | HR 55 | Temp 98.0°F | Resp 18

## 2013-12-21 DIAGNOSIS — C6290 Malignant neoplasm of unspecified testis, unspecified whether descended or undescended: Secondary | ICD-10-CM

## 2013-12-21 DIAGNOSIS — Z5111 Encounter for antineoplastic chemotherapy: Secondary | ICD-10-CM

## 2013-12-21 MED ORDER — FAMOTIDINE IN NACL 20-0.9 MG/50ML-% IV SOLN
20.0000 mg | Freq: Two times a day (BID) | INTRAVENOUS | Status: DC
  Administered 2013-12-21: 20 mg via INTRAVENOUS

## 2013-12-21 MED ORDER — FAMOTIDINE IN NACL 20-0.9 MG/50ML-% IV SOLN
INTRAVENOUS | Status: AC
  Filled 2013-12-21: qty 50

## 2013-12-21 MED ORDER — CISPLATIN CHEMO INJECTION 100MG/100ML
20.0000 mg/m2 | Freq: Once | INTRAVENOUS | Status: AC
  Administered 2013-12-21: 45 mg via INTRAVENOUS
  Filled 2013-12-21: qty 45

## 2013-12-21 MED ORDER — DEXAMETHASONE SODIUM PHOSPHATE 20 MG/5ML IJ SOLN
20.0000 mg | Freq: Once | INTRAMUSCULAR | Status: AC
  Administered 2013-12-21: 20 mg via INTRAVENOUS

## 2013-12-21 MED ORDER — POTASSIUM CHLORIDE 2 MEQ/ML IV SOLN
Freq: Once | INTRAVENOUS | Status: AC
  Administered 2013-12-21: 10:00:00 via INTRAVENOUS
  Filled 2013-12-21: qty 10

## 2013-12-21 MED ORDER — DEXAMETHASONE SODIUM PHOSPHATE 20 MG/5ML IJ SOLN
INTRAMUSCULAR | Status: AC
  Filled 2013-12-21: qty 5

## 2013-12-21 MED ORDER — ETOPOSIDE CHEMO INJECTION 1 GM/50ML
100.0000 mg/m2 | Freq: Once | INTRAVENOUS | Status: AC
  Administered 2013-12-21: 220 mg via INTRAVENOUS
  Filled 2013-12-21: qty 11

## 2013-12-21 MED ORDER — SODIUM CHLORIDE 0.9 % IV SOLN
Freq: Once | INTRAVENOUS | Status: AC
  Administered 2013-12-21: 09:00:00 via INTRAVENOUS

## 2013-12-21 NOTE — Progress Notes (Signed)
Patient C/O heartburn and indigestion.  Verbal order received and read back from Dr. Alen Blew for Pepcid 20 mg IV x 1 today.   Upon discharge asked when he can repeat the Pepcid.  Excellent relief today with use of Pepcid.

## 2013-12-21 NOTE — Progress Notes (Signed)
Discharged at 1550 with spouse to home in no distress.

## 2013-12-21 NOTE — Patient Instructions (Signed)
[facility] Discharge Instructions for Patients Receiving Chemotherapy  Today you received the following chemotherapy agents Cisplatin, Etopiside  To help prevent nausea and vomiting after your treatment, we encourage you to take your nausea medication Compazine 10 mg every 6 hours as needed.   If you develop nausea and vomiting that is not controlled by your nausea medication, call the clinic.   BELOW ARE SYMPTOMS THAT SHOULD BE REPORTED IMMEDIATELY:  *FEVER GREATER THAN 100.5 F  *CHILLS WITH OR WITHOUT FEVER  NAUSEA AND VOMITING THAT IS NOT CONTROLLED WITH YOUR NAUSEA MEDICATION  *UNUSUAL SHORTNESS OF BREATH  *UNUSUAL BRUISING OR BLEEDING  TENDERNESS IN MOUTH AND THROAT WITH OR WITHOUT PRESENCE OF ULCERS  *URINARY PROBLEMS  *BOWEL PROBLEMS  UNUSUAL RASH Items with * indicate a potential emergency and should be followed up as soon as possible.  Feel free to call the clinic you have any questions or concerns. The clinic phone number is [phone].

## 2013-12-22 ENCOUNTER — Ambulatory Visit (HOSPITAL_BASED_OUTPATIENT_CLINIC_OR_DEPARTMENT_OTHER): Payer: Managed Care, Other (non HMO)

## 2013-12-22 VITALS — BP 120/74 | HR 79 | Temp 97.9°F | Resp 16

## 2013-12-22 DIAGNOSIS — Z5111 Encounter for antineoplastic chemotherapy: Secondary | ICD-10-CM

## 2013-12-22 DIAGNOSIS — C6290 Malignant neoplasm of unspecified testis, unspecified whether descended or undescended: Secondary | ICD-10-CM

## 2013-12-22 MED ORDER — SODIUM CHLORIDE 0.9 % IV SOLN
Freq: Once | INTRAVENOUS | Status: AC
  Administered 2013-12-22: 08:00:00 via INTRAVENOUS

## 2013-12-22 MED ORDER — DEXAMETHASONE SODIUM PHOSPHATE 20 MG/5ML IJ SOLN
INTRAMUSCULAR | Status: AC
  Filled 2013-12-22: qty 5

## 2013-12-22 MED ORDER — PALONOSETRON HCL INJECTION 0.25 MG/5ML
0.2500 mg | Freq: Once | INTRAVENOUS | Status: AC
  Administered 2013-12-22: 0.25 mg via INTRAVENOUS

## 2013-12-22 MED ORDER — SODIUM CHLORIDE 0.9 % IV SOLN
100.0000 mg/m2 | Freq: Once | INTRAVENOUS | Status: AC
  Administered 2013-12-22: 220 mg via INTRAVENOUS
  Filled 2013-12-22: qty 11

## 2013-12-22 MED ORDER — PALONOSETRON HCL INJECTION 0.25 MG/5ML
INTRAVENOUS | Status: AC
  Filled 2013-12-22: qty 5

## 2013-12-22 MED ORDER — SODIUM CHLORIDE 0.9 % IV SOLN
20.0000 mg/m2 | Freq: Once | INTRAVENOUS | Status: AC
  Administered 2013-12-22: 45 mg via INTRAVENOUS
  Filled 2013-12-22: qty 45

## 2013-12-22 MED ORDER — DEXTROSE-NACL 5-0.45 % IV SOLN
Freq: Once | INTRAVENOUS | Status: AC
  Administered 2013-12-22: 08:00:00 via INTRAVENOUS
  Filled 2013-12-22: qty 10

## 2013-12-22 MED ORDER — DEXAMETHASONE SODIUM PHOSPHATE 20 MG/5ML IJ SOLN
20.0000 mg | Freq: Once | INTRAMUSCULAR | Status: AC
  Administered 2013-12-22: 20 mg via INTRAVENOUS

## 2013-12-22 NOTE — Patient Instructions (Signed)
[facility] Discharge Instructions for Patients Receiving Chemotherapy  Today you received the following chemotherapy agents Cisplatin, Etoposide.   To help prevent nausea and vomiting after your treatment, we encourage you to take your nausea medication as prescribed.    If you develop nausea and vomiting that is not controlled by your nausea medication, call the clinic.   BELOW ARE SYMPTOMS THAT SHOULD BE REPORTED IMMEDIATELY:  *FEVER GREATER THAN 100.5 F  *CHILLS WITH OR WITHOUT FEVER  NAUSEA AND VOMITING THAT IS NOT CONTROLLED WITH YOUR NAUSEA MEDICATION  *UNUSUAL SHORTNESS OF BREATH  *UNUSUAL BRUISING OR BLEEDING  TENDERNESS IN MOUTH AND THROAT WITH OR WITHOUT PRESENCE OF ULCERS  *URINARY PROBLEMS  *BOWEL PROBLEMS  UNUSUAL RASH Items with * indicate a potential emergency and should be followed up as soon as possible.  Feel free to call the clinic should you have any questions or concerns. The clinic phone number is [phone].  It was my pleasure to take care of you today!  Leeanne Rio, RN

## 2013-12-26 ENCOUNTER — Other Ambulatory Visit (HOSPITAL_BASED_OUTPATIENT_CLINIC_OR_DEPARTMENT_OTHER): Payer: Managed Care, Other (non HMO)

## 2013-12-26 ENCOUNTER — Ambulatory Visit (HOSPITAL_BASED_OUTPATIENT_CLINIC_OR_DEPARTMENT_OTHER): Payer: Managed Care, Other (non HMO)

## 2013-12-26 ENCOUNTER — Encounter: Payer: Self-pay | Admitting: Medical Oncology

## 2013-12-26 ENCOUNTER — Encounter (INDEPENDENT_AMBULATORY_CARE_PROVIDER_SITE_OTHER): Payer: Self-pay

## 2013-12-26 ENCOUNTER — Telehealth: Payer: Self-pay | Admitting: Oncology

## 2013-12-26 ENCOUNTER — Encounter: Payer: Self-pay | Admitting: Oncology

## 2013-12-26 ENCOUNTER — Telehealth: Payer: Self-pay | Admitting: *Deleted

## 2013-12-26 ENCOUNTER — Ambulatory Visit (HOSPITAL_BASED_OUTPATIENT_CLINIC_OR_DEPARTMENT_OTHER): Payer: Managed Care, Other (non HMO) | Admitting: Oncology

## 2013-12-26 ENCOUNTER — Other Ambulatory Visit: Payer: Self-pay | Admitting: Radiology

## 2013-12-26 ENCOUNTER — Encounter (HOSPITAL_COMMUNITY): Payer: Self-pay | Admitting: Pharmacy Technician

## 2013-12-26 VITALS — BP 131/78 | HR 97 | Temp 98.1°F | Resp 18 | Ht 69.0 in | Wt 220.8 lb

## 2013-12-26 DIAGNOSIS — C6290 Malignant neoplasm of unspecified testis, unspecified whether descended or undescended: Secondary | ICD-10-CM

## 2013-12-26 DIAGNOSIS — R059 Cough, unspecified: Secondary | ICD-10-CM

## 2013-12-26 DIAGNOSIS — Z5111 Encounter for antineoplastic chemotherapy: Secondary | ICD-10-CM

## 2013-12-26 DIAGNOSIS — R05 Cough: Secondary | ICD-10-CM

## 2013-12-26 LAB — CBC WITH DIFFERENTIAL/PLATELET
BASO%: 0.2 % (ref 0.0–2.0)
Basophils Absolute: 0 10*3/uL (ref 0.0–0.1)
EOS ABS: 0.1 10*3/uL (ref 0.0–0.5)
EOS%: 1.7 % (ref 0.0–7.0)
HEMATOCRIT: 40.1 % (ref 38.4–49.9)
HGB: 13.5 g/dL (ref 13.0–17.1)
LYMPH%: 16.4 % (ref 14.0–49.0)
MCH: 28.2 pg (ref 27.2–33.4)
MCHC: 33.7 g/dL (ref 32.0–36.0)
MCV: 83.7 fL (ref 79.3–98.0)
MONO#: 0.1 10*3/uL (ref 0.1–0.9)
MONO%: 1.2 % (ref 0.0–14.0)
NEUT%: 80.5 % — AB (ref 39.0–75.0)
NEUTROS ABS: 3.4 10*3/uL (ref 1.5–6.5)
PLATELETS: 156 10*3/uL (ref 140–400)
RBC: 4.79 10*6/uL (ref 4.20–5.82)
RDW: 13.4 % (ref 11.0–14.6)
WBC: 4.2 10*3/uL (ref 4.0–10.3)
lymph#: 0.7 10*3/uL — ABNORMAL LOW (ref 0.9–3.3)

## 2013-12-26 LAB — COMPREHENSIVE METABOLIC PANEL
ALK PHOS: 46 U/L (ref 39–117)
ALT: 36 U/L (ref 0–53)
AST: 23 U/L (ref 0–37)
Albumin: 3.9 g/dL (ref 3.5–5.2)
BUN: 14 mg/dL (ref 6–23)
CO2: 22 meq/L (ref 19–32)
Calcium: 8.7 mg/dL (ref 8.4–10.5)
Chloride: 99 mEq/L (ref 96–112)
Creatinine, Ser: 1.05 mg/dL (ref 0.50–1.35)
Glucose, Bld: 99 mg/dL (ref 70–99)
Potassium: 4.1 mEq/L (ref 3.5–5.3)
Sodium: 132 mEq/L — ABNORMAL LOW (ref 135–145)
TOTAL PROTEIN: 6.6 g/dL (ref 6.0–8.3)
Total Bilirubin: 0.5 mg/dL (ref 0.2–1.2)

## 2013-12-26 MED ORDER — PROCHLORPERAZINE MALEATE 10 MG PO TABS
10.0000 mg | ORAL_TABLET | Freq: Once | ORAL | Status: AC
  Administered 2013-12-26: 10 mg via ORAL

## 2013-12-26 MED ORDER — PROCHLORPERAZINE MALEATE 10 MG PO TABS
ORAL_TABLET | ORAL | Status: AC
  Filled 2013-12-26: qty 1

## 2013-12-26 MED ORDER — LIDOCAINE-PRILOCAINE 2.5-2.5 % EX CREA: 1.0000 "application " | TOPICAL_CREAM | CUTANEOUS | Status: DC | PRN

## 2013-12-26 MED ORDER — SODIUM CHLORIDE 0.9 % IV SOLN
30.0000 [IU] | Freq: Once | INTRAVENOUS | Status: AC
  Administered 2013-12-26: 30 [IU] via INTRAVENOUS
  Filled 2013-12-26: qty 10

## 2013-12-26 MED ORDER — SODIUM CHLORIDE 0.9 % IV SOLN
Freq: Once | INTRAVENOUS | Status: AC
  Administered 2013-12-26: 11:00:00 via INTRAVENOUS

## 2013-12-26 NOTE — Progress Notes (Signed)
Hematology and Oncology Follow Up Visit  Roberto Ruiz [id number] 03-Aug-1966 48 y.o. 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 The pathology revealed mixed histology with 60% seminoma, 40% embryonal. Lymphovascular invasion was identified.   Prior Therapy: He is status post left orchiectomy done on 11/13/2013.  Current therapy: He is currently receiving adjuvant chemotherapy in the form of BEP today is day 9 of cycle 1.  Interim History: Patient presents today for a followup visit. He tolerated the first week of chemotherapy without any major complications. He developed a grade 1 nausea, grade 1 fatigue, and generalized tiredness. He did not report any vomiting. Did not report any peripheral neuropathy. Did not report any shortness of breath or difficulty breathing. Does report occasional cough but no exertional dyspnea. Did not report any hemoptysis or hematemesis. He is able to perform most activities of daily living without any hindrance or decline. Did not have any hospitalizations or illnesses.  Medications: I have reviewed the patient's current medications.  Current Outpatient Prescriptions  Medication Sig Dispense Refill  . prochlorperazine (COMPAZINE) 10 MG tablet Take 1 tablet (10 mg total) by mouth every 6 (six) hours as needed for nausea or vomiting.  30 tablet  0  . lidocaine-prilocaine (EMLA) cream Apply 1 application topically as needed.  30 g  0   No current facility-administered medications for this visit.     Allergies:  Allergies  Allergen Reactions  . Sulfa Antibiotics Hives    Past Medical History, Surgical history, Social history, and Family History were reviewed and updated.  Review of Systems: Constitutional:  Negative for fever, chills, night sweats, anorexia, weight loss, pain. Cardiovascular: no chest  pain or dyspnea on exertion Respiratory: no cough, shortness of breath, or wheezing Neurological: no TIA or stroke symptoms Dermatological: negative for pruritus and rash ENT: negative for - headaches, hearing change or nasal congestion Skin: Negative. Gastrointestinal: no abdominal pain, change in bowel habits, or black or bloody stools Genito-Urinary: no dysuria, trouble voiding, or hematuria Hematological and Lymphatic: negative for - bleeding problems, night sweats, pallor or swollen lymph nodes Breast: negative Musculoskeletal: negative for - gait disturbance, muscle pain or muscular weakness Remaining ROS negative. Physical Exam: Blood pressure 131/78, pulse 97, temperature 98.1 F (36.7 C), temperature source Oral, resp. rate 18, height 5\' 9"  (1.753 m), weight 220 lb 12.8 oz (100.154 kg). ECOG: 0 General appearance: alert, cooperative and appears stated age Head: Normocephalic, without obvious abnormality, atraumatic Neck: no adenopathy, no carotid bruit, no JVD, supple, symmetrical, trachea midline and thyroid not enlarged, symmetric, no tenderness/mass/nodules Lymph nodes: Cervical, supraclavicular, and axillary nodes normal. Heart:regular rate and rhythm, S1, S2 normal, no murmur, click, rub or gallop Lung:chest clear, no wheezing, rales, normal symmetric air entry Abdomin: soft, non-tender, without masses or organomegaly EXT:no erythema, induration, or nodules   Lab Results: Lab Results  Component Value Date   WBC 4.2 12/26/2013   HGB 13.5 12/26/2013   HCT 40.1 12/26/2013   MCV 83.7 12/26/2013   PLT 156 12/26/2013     Chemistry      Component Value Date/Time   NA 143 12/18/2013 0801   K 4.0 12/18/2013 0801   CO2 25 12/18/2013 0801   BUN 11.7 12/18/2013 0801   CREATININE 1.1 12/18/2013 0801      Component Value Date/Time   CALCIUM 9.8 12/18/2013 0801   ALKPHOS  59 12/18/2013 0801   AST 34 12/18/2013 0801   ALT 42 12/18/2013 0801   BILITOT 0.69 12/18/2013 0801       Impression  and Plan:  48 year old gentleman with the following issues:  1. Nonseminomatous testicular cancer presented with a T2 N0 stage IB disease. He has a 40% embryonal component was lymphovascular invasion. He is receiving adjuvant chemotherapy in the form of BEP. He does not report any major complications are noted the to proceed with bleomycin today. The plan is to proceed with chemotherapy today and day 16 next week. He will complete 2 cycles of BEP around March 24.  2. IV access: He had been having a lot of difficulties with IV access and requested a Port-A-Cath insertion. Risks and benefits of this procedure were discussed today in detail. Complications include thrombosis, bleeding, and infection were discussed and he is willing to proceed. I've given him prescription for an abnormal cream as well.  3. Nausea prophylaxis: He has Compazine which have worked well at this time without any vomiting episodes.  4. Bleomycin toxicity screen: He has not reported any respiratory symptoms we will continue to monitor this very closely.  5. VTE prophylaxis: Continue to educate him about signs of symptoms of thrombosis and to report any symptoms as soon as possible.    [facility], MD 2/17/201510:16 AM

## 2013-12-26 NOTE — Patient Instructions (Signed)
[facility] Discharge Instructions for Patients Receiving Chemotherapy  Today you received the following chemotherapy agents bleomycin  To help prevent nausea and vomiting after your treatment, we encourage you to take your nausea medication compazine about 530 pm if you need it.   If you develop nausea and vomiting that is not controlled by your nausea medication, call the clinic.   BELOW ARE SYMPTOMS THAT SHOULD BE REPORTED IMMEDIATELY:  *FEVER GREATER THAN 100.5 F  *CHILLS WITH OR WITHOUT FEVER  NAUSEA AND VOMITING THAT IS NOT CONTROLLED WITH YOUR NAUSEA MEDICATION  *UNUSUAL SHORTNESS OF BREATH  *UNUSUAL BRUISING OR BLEEDING  TENDERNESS IN MOUTH AND THROAT WITH OR WITHOUT PRESENCE OF ULCERS  *URINARY PROBLEMS  *BOWEL PROBLEMS  UNUSUAL RASH Items with * indicate a potential emergency and should be followed up as soon as possible.  Feel free to call the clinic you have any questions or concerns. The clinic phone number is [phone].

## 2013-12-26 NOTE — Progress Notes (Signed)
Papers for short term disability claims from [employer] placed on [employer], managed care.

## 2013-12-26 NOTE — Telephone Encounter (Signed)
Reached Roberto Ruiz at home.  Sounds tired and sleepy.  Inquired about how he is tolerated the first week of chemotherapy.  "I'm getting through it.  I have medicine I'm taking to help nausea."  No further information given.  Repeats "I'm getting through it" to all questions asked.  Denies questions.  Encouraged to call if any changes or questions.

## 2013-12-26 NOTE — Telephone Encounter (Signed)
Message copied by Cherylynn Ridges on Tue Dec 26, 2013  2:02 PM ------      Message from: [place], [place] E      Created: Mon Dec 18, 2013  3:59 PM      Regarding: Chemo Follow up       Testicular-Cisplat/Bleo/VP16 ------

## 2013-12-26 NOTE — Telephone Encounter (Signed)
gv pt appt schedule for feb/mach and appt w/IR for port placement 2/19 to arrive @ 12:30pm (s/w Tiffany in IR).

## 2013-12-28 ENCOUNTER — Other Ambulatory Visit: Payer: Self-pay | Admitting: Oncology

## 2013-12-28 ENCOUNTER — Encounter (HOSPITAL_COMMUNITY): Payer: Self-pay

## 2013-12-28 ENCOUNTER — Encounter: Payer: Self-pay | Admitting: Oncology

## 2013-12-28 ENCOUNTER — Ambulatory Visit (HOSPITAL_COMMUNITY)
Admission: RE | Admit: 2013-12-28 | Discharge: 2013-12-28 | Disposition: A | Discharge status: Home / Self Care | Payer: Managed Care, Other (non HMO) | Source: Ambulatory Visit | Attending: Oncology | Admitting: Oncology

## 2013-12-28 DIAGNOSIS — C6290 Malignant neoplasm of unspecified testis, unspecified whether descended or undescended: Secondary | ICD-10-CM

## 2013-12-28 DIAGNOSIS — K219 Gastro-esophageal reflux disease without esophagitis: Secondary | ICD-10-CM | POA: Insufficient documentation

## 2013-12-28 HISTORY — DX: Gastro-esophageal reflux disease without esophagitis: K21.9

## 2013-12-28 HISTORY — DX: Malignant (primary) neoplasm, unspecified: C80.1

## 2013-12-28 LAB — APTT: aPTT: 27 seconds (ref 24–37)

## 2013-12-28 LAB — PROTIME-INR
INR: 0.97 (ref 0.00–1.49)
Prothrombin Time: 12.7 seconds (ref 11.6–15.2)

## 2013-12-28 MED ORDER — HEPARIN SOD (PORK) LOCK FLUSH 100 UNIT/ML IV SOLN: 500.0000 [IU] | Freq: Once | INTRAVENOUS | Status: DC

## 2013-12-28 MED ORDER — LIDOCAINE HCL 1 % IJ SOLN
INTRAMUSCULAR | Status: DC
Start: 2013-12-28 — End: 2013-12-29
  Filled 2013-12-28: qty 20

## 2013-12-28 MED ORDER — FENTANYL CITRATE 0.05 MG/ML IJ SOLN
INTRAMUSCULAR | Status: AC | PRN
  Administered 2013-12-28: 100 ug via INTRAVENOUS

## 2013-12-28 MED ORDER — MIDAZOLAM HCL 2 MG/2ML IJ SOLN
INTRAMUSCULAR | Status: AC | PRN
  Administered 2013-12-28: 1 mg via INTRAVENOUS
  Administered 2013-12-28: 2 mg via INTRAVENOUS
  Administered 2013-12-28: 1 mg via INTRAVENOUS

## 2013-12-28 MED ORDER — MIDAZOLAM HCL 2 MG/2ML IJ SOLN
INTRAMUSCULAR | Status: DC
Start: 2013-12-28 — End: 2013-12-29
  Filled 2013-12-28: qty 4

## 2013-12-28 MED ORDER — CEFAZOLIN SODIUM-DEXTROSE 2-3 GM-% IV SOLR
2.0000 g | INTRAVENOUS | Status: AC
  Administered 2013-12-28: 2 g via INTRAVENOUS
  Filled 2013-12-28: qty 50

## 2013-12-28 MED ORDER — HEPARIN SOD (PORK) LOCK FLUSH 100 UNIT/ML IV SOLN
INTRAVENOUS | Status: AC
  Filled 2013-12-28: qty 5

## 2013-12-28 MED ORDER — SODIUM CHLORIDE 0.9 % IV SOLN
INTRAVENOUS | Status: DC
  Administered 2013-12-28: 12:00:00 via INTRAVENOUS

## 2013-12-28 MED ORDER — FENTANYL CITRATE 0.05 MG/ML IJ SOLN
INTRAMUSCULAR | Status: AC
  Filled 2013-12-28: qty 4

## 2013-12-28 NOTE — H&P (Signed)
Roberto Ruiz is an 48 y.o. male.   Chief Complaint: "I'm here for a port a cath" HPI: Patient with history of stage IB nonseminomatous testicular carcinoma presents today for port a cath placement for chemotherapy.  Past Medical History  Diagnosis Date  . Wears contact lenses   . Cancer     testicular  . GERD (gastroesophageal reflux disease)     with Chemo    Past Surgical History  Procedure Laterality Date  . Tonsillectomy and adenoidectomy  1990  . Right arthroscopy w/ open removal of reputured bursa  1987  . Orchiectomy Left 11/13/2013    Procedure: LEFT RADICAL INGUINAL ORCHIECTOMY;  Surgeon: Molli Hazard, MD;  Location: [facility];  Service: Urology;  Laterality: Left;    No family history on file. Social History:  reports that he has never smoked. He has never used smokeless tobacco. He reports that he does not drink alcohol or use illicit drugs.  Allergies:  Allergies  Allergen Reactions  . Sulfa Antibiotics Hives    Current outpatient prescriptions:famotidine-calcium carbonate-magnesium hydroxide (PEPCID COMPLETE) [phone] MG CHEW chewable tablet, Chew 1 tablet by mouth daily as needed., Disp: , Rfl: ;  prochlorperazine (COMPAZINE) 10 MG tablet, Take 1 tablet (10 mg total) by mouth every 6 (six) hours as needed for nausea or vomiting., Disp: 30 tablet, Rfl: 0 ibuprofen (ADVIL,MOTRIN) 200 MG tablet, Take 200 mg by mouth every 6 (six) hours as needed for mild pain or moderate pain., Disp: , Rfl: ;  lidocaine-prilocaine (EMLA) cream, Apply 1 application topically as needed., Disp: 30 g, Rfl: 0 Current facility-administered medications:0.9 %  sodium chloride infusion, , Intravenous, Continuous, D Kevin Rooney Gladwin, PA-C, Last Rate: 20 mL/hr at 12/28/13 1220;  ceFAZolin (ANCEF) IVPB 2 g/50 mL premix, 2 g, Intravenous, On Call, D Rowe Robert, PA-C   Results for orders placed during the hospital encounter of 12/28/13 (from the past 48 hour(s))  APTT      Status: None   Collection Time    12/28/13 12:20 PM      Result Value Ref Range   aPTT 27  24 - 37 seconds  PROTIME-INR     Status: None   Collection Time    12/28/13 12:20 PM      Result Value Ref Range   Prothrombin Time 12.7  11.6 - 15.2 seconds   INR 0.97  0.00 - 1.49   12/26/13  WBC 4.2  HGB 13.5  PLTS 156K Review of Systems  Constitutional: Negative for fever and chills.  Respiratory: Positive for cough. Negative for hemoptysis and shortness of breath.   Cardiovascular: Negative for chest pain.  Gastrointestinal: Negative for nausea, vomiting and abdominal pain.  Musculoskeletal: Negative for back pain.  Neurological: Negative for headaches.  Endo/Heme/Allergies: Does not bruise/bleed easily.   Vitals: BP 120/72  HR 88 R 16  TEMP 97.9  O2 SATS 100% RA Physical Exam  Constitutional: He is oriented to person, place, and time. He appears well-developed and well-nourished.  Cardiovascular: Normal rate and regular rhythm.   Respiratory: Effort normal and breath sounds normal.  GI: Soft. Bowel sounds are normal. There is no tenderness.  Musculoskeletal: Normal range of motion. He exhibits no edema.  Neurological: He is alert and oriented to person, place, and time.     Assessment/Plan Patient with history of stage IB nonseminomatous testicular carcinoma presents today for port a cath placement for chemotherapy. Details/risks of procedure d/w pt /wife with their understanding and consent.  Karmela Bram,D  KEVIN 12/28/2013, 12:55 PM

## 2013-12-28 NOTE — Progress Notes (Signed)
Put [employer] disability form on nurse's desk. °

## 2013-12-28 NOTE — Procedures (Signed)
Procedure:  Porta-cath Access:  Right IJ vein SL Power Port placed.  Tip at cavoatrial junction.  No PTX.  OK to use.

## 2013-12-28 NOTE — H&P (Signed)
Agree.  For port placement today. 

## 2013-12-28 NOTE — Discharge Instructions (Signed)

## 2014-01-01 ENCOUNTER — Other Ambulatory Visit: Payer: Managed Care, Other (non HMO)

## 2014-01-01 ENCOUNTER — Encounter: Payer: Self-pay | Admitting: Oncology

## 2014-01-01 ENCOUNTER — Ambulatory Visit: Payer: Managed Care, Other (non HMO)

## 2014-01-01 NOTE — Progress Notes (Signed)
Faxed disability form to [employer] @ [phone] °

## 2014-01-02 ENCOUNTER — Other Ambulatory Visit: Payer: Self-pay | Admitting: *Deleted

## 2014-01-02 ENCOUNTER — Other Ambulatory Visit (HOSPITAL_BASED_OUTPATIENT_CLINIC_OR_DEPARTMENT_OTHER): Payer: Managed Care, Other (non HMO)

## 2014-01-02 ENCOUNTER — Encounter (INDEPENDENT_AMBULATORY_CARE_PROVIDER_SITE_OTHER): Payer: Self-pay

## 2014-01-02 ENCOUNTER — Ambulatory Visit: Payer: Managed Care, Other (non HMO)

## 2014-01-02 ENCOUNTER — Ambulatory Visit (HOSPITAL_BASED_OUTPATIENT_CLINIC_OR_DEPARTMENT_OTHER): Payer: Managed Care, Other (non HMO)

## 2014-01-02 ENCOUNTER — Other Ambulatory Visit: Payer: Managed Care, Other (non HMO)

## 2014-01-02 VITALS — BP 127/83 | HR 79 | Temp 98.7°F | Resp 16

## 2014-01-02 DIAGNOSIS — C6290 Malignant neoplasm of unspecified testis, unspecified whether descended or undescended: Secondary | ICD-10-CM

## 2014-01-02 DIAGNOSIS — Z5111 Encounter for antineoplastic chemotherapy: Secondary | ICD-10-CM

## 2014-01-02 LAB — CBC WITH DIFFERENTIAL/PLATELET
BASO%: 4.1 % — ABNORMAL HIGH (ref 0.0–2.0)
BASOS ABS: 0.1 10*3/uL (ref 0.0–0.1)
EOS ABS: 0 10*3/uL (ref 0.0–0.5)
EOS%: 2.1 % (ref 0.0–7.0)
HCT: 35.3 % — ABNORMAL LOW (ref 38.4–49.9)
HEMOGLOBIN: 12.1 g/dL — AB (ref 13.0–17.1)
LYMPH#: 0.9 10*3/uL (ref 0.9–3.3)
LYMPH%: 61.6 % — ABNORMAL HIGH (ref 14.0–49.0)
MCH: 28.5 pg (ref 27.2–33.4)
MCHC: 34.3 g/dL (ref 32.0–36.0)
MCV: 83.1 fL (ref 79.3–98.0)
MONO#: 0.2 10*3/uL (ref 0.1–0.9)
MONO%: 16.4 % — ABNORMAL HIGH (ref 0.0–14.0)
NEUT#: 0.2 10*3/uL — CL (ref 1.5–6.5)
NEUT%: 15.8 % — ABNORMAL LOW (ref 39.0–75.0)
NRBC: 0 % (ref 0–0)
Platelets: 172 10*3/uL (ref 140–400)
RBC: 4.25 10*6/uL (ref 4.20–5.82)
RDW: 13.6 % (ref 11.0–14.6)
WBC: 1.5 10*3/uL — AB (ref 4.0–10.3)

## 2014-01-02 LAB — COMPREHENSIVE METABOLIC PANEL (CC13)
ALBUMIN: 3.4 g/dL — AB (ref 3.5–5.0)
ALT: 19 U/L (ref 0–55)
AST: 15 U/L (ref 5–34)
Alkaline Phosphatase: 55 U/L (ref 40–150)
Anion Gap: 7 mEq/L (ref 3–11)
BUN: 13.5 mg/dL (ref 7.0–26.0)
CO2: 24 meq/L (ref 22–29)
Calcium: 9.2 mg/dL (ref 8.4–10.4)
Chloride: 109 mEq/L (ref 98–109)
Creatinine: 0.9 mg/dL (ref 0.7–1.3)
GLUCOSE: 95 mg/dL (ref 70–140)
POTASSIUM: 3.8 meq/L (ref 3.5–5.1)
SODIUM: 139 meq/L (ref 136–145)
TOTAL PROTEIN: 6.5 g/dL (ref 6.4–8.3)
Total Bilirubin: 0.31 mg/dL (ref 0.20–1.20)

## 2014-01-02 MED ORDER — HEPARIN SOD (PORK) LOCK FLUSH 100 UNIT/ML IV SOLN
500.0000 [IU] | Freq: Once | INTRAVENOUS | Status: AC | PRN
  Administered 2014-01-02: 500 [IU]
  Filled 2014-01-02: qty 5

## 2014-01-02 MED ORDER — SODIUM CHLORIDE 0.9 % IV SOLN
30.0000 [IU] | Freq: Once | INTRAVENOUS | Status: AC
  Administered 2014-01-02: 30 [IU] via INTRAVENOUS
  Filled 2014-01-02: qty 10

## 2014-01-02 MED ORDER — PROCHLORPERAZINE MALEATE 10 MG PO TABS
10.0000 mg | ORAL_TABLET | Freq: Once | ORAL | Status: AC
  Administered 2014-01-02: 10 mg via ORAL

## 2014-01-02 MED ORDER — SODIUM CHLORIDE 0.9 % IV SOLN
Freq: Once | INTRAVENOUS | Status: AC
  Administered 2014-01-02: 10:00:00 via INTRAVENOUS

## 2014-01-02 MED ORDER — PROCHLORPERAZINE MALEATE 10 MG PO TABS: 10.0000 mg | ORAL_TABLET | Freq: Four times a day (QID) | ORAL | Status: DC | PRN

## 2014-01-02 MED ORDER — SODIUM CHLORIDE 0.9 % IJ SOLN
10.0000 mL | INTRAMUSCULAR | Status: DC | PRN
  Administered 2014-01-02: 10 mL
  Filled 2014-01-02: qty 10

## 2014-01-02 MED ORDER — PROCHLORPERAZINE MALEATE 10 MG PO TABS
ORAL_TABLET | ORAL | Status: AC
  Filled 2014-01-02: qty 1

## 2014-01-02 NOTE — Progress Notes (Signed)
Okay to treat despite ANC 0.2 per Dr. Alen Blew. Patient given info on neutropenic precautions and several face masks. Pt agreeable to wearing them whenever he is out in public. Cindi Carbon, RN

## 2014-01-02 NOTE — Patient Instructions (Signed)
[facility] Discharge Instructions for Patients Receiving Chemotherapy  Today you received the following chemotherapy agents Bleomycin.  To help prevent nausea and vomiting after your treatment, we encourage you to take your nausea medication as prescribed.   If you develop nausea and vomiting that is not controlled by your nausea medication, call the clinic.   BELOW ARE SYMPTOMS THAT SHOULD BE REPORTED IMMEDIATELY:  *FEVER GREATER THAN 100.5 F  *CHILLS WITH OR WITHOUT FEVER  NAUSEA AND VOMITING THAT IS NOT CONTROLLED WITH YOUR NAUSEA MEDICATION  *UNUSUAL SHORTNESS OF BREATH  *UNUSUAL BRUISING OR BLEEDING  TENDERNESS IN MOUTH AND THROAT WITH OR WITHOUT PRESENCE OF ULCERS  *URINARY PROBLEMS  *BOWEL PROBLEMS  UNUSUAL RASH Items with * indicate a potential emergency and should be followed up as soon as possible.  Feel free to call the clinic you have any questions or concerns. The clinic phone number is [phone].  Neutropenia Neutropenia is a condition that occurs when the level of a certain type of white blood cell (neutrophil) in your body becomes lower than normal. Neutrophils are made in the bone marrow and fight infections. These cells protect against bacteria and viruses. The fewer neutrophils you have, and the longer your body remains without them, the greater your risk of getting a severe infection becomes. CAUSES  The cause of neutropenia may be hard to determine. However, it is usually due to 3 main problems:   Decreased production of neutrophils. This may be due to:  Certain medicines such as chemotherapy.  Genetic problems.  Cancer.  Radiation treatments.  Vitamin deficiency.  Some pesticides.  Increased destruction of neutrophils. This may be due to:  Overwhelming infections.  Hemolytic anemia. This is when the body destroys its own blood cells.  Chemotherapy.  Neutrophils moving to areas of the body where they cannot fight  infections. This may be due to:  Dialysis procedures.  Conditions where the spleen becomes enlarged. Neutrophils are held in the spleen and are not available to the rest of the body.  Overwhelming infections. The neutrophils are held in the area of the infection and are not available to the rest of the body. SYMPTOMS  There are no specific symptoms of neutropenia. The lack of neutrophils can result in an infection, and an infection can cause various problems. DIAGNOSIS  Diagnosis is made by a blood test. A complete blood count is performed. The normal level of neutrophils in human blood differs with age and race. Infants have lower counts than older children and adults. African Americans have lower counts than Caucasians or Asians. The average adult level is 1500 cells/mm3 of blood. Neutrophil counts are interpreted as follows:  Greater than 1000 cells/mm3 gives normal protection against infection.  500 to 1000 cells/mm3 gives an increased risk for infection.  200 to 500 cells/mm3 is a greater risk for severe infection.  Lower than 200 cells/mm3 is a marked risk of infection. This may require hospitalization and treatment with antibiotic medicines. TREATMENT  Treatment depends on the underlying cause, severity, and presence of infections or symptoms. It also depends on your health. Your caregiver will discuss the treatment plan with you. Mild cases are often easily treated and have a good outcome. Preventative measures may also be started to limit your risk of infections. Treatment can include:  Taking antibiotics.  Stopping medicines that are known to cause neutropenia.  Correcting nutritional deficiencies by eating green vegetables to supply folic acid and taking vitamin B supplements.  Stopping exposure  to pesticides if your neutropenia is related to pesticide exposure.  Taking a blood growth factor called sargramostim, pegfilgrastim, or filgrastim if you are undergoing chemotherapy  for cancer. This stimulates white blood cell production.  Removal of the spleen if you have Felty's syndrome and have repeated infections. HOME CARE INSTRUCTIONS   Follow your caregiver's instructions about when you need to have blood work done.  Wash your hands often. Make sure others who come in contact with you also wash their hands.  Wash raw fruits and vegetables before eating them. They can carry bacteria and fungi.  Avoid people with colds or spreadable (contagious) diseases (chickenpox, herpes zoster, influenza).  Avoid large crowds.  Avoid construction areas. The dust can release fungus into the air.  Be cautious around children in daycare or school environments.  Take care of your respiratory system by coughing and deep breathing.  Bathe daily.  Protect your skin from cuts and burns.  Do not work in the garden or with flowers and plants.  Care for the mouth before and after meals by brushing with a soft toothbrush. If you have mucositis, do not use mouthwash. Mouthwash contains alcohol and can dry out the mouth even more.  Clean the area between the genitals and the anus (perineal area) after urination and bowel movements. Women need to wipe from front to back.  Use a water soluble lubricant during sexual intercourse and practice good hygiene after. Do not have intercourse if you are severely neutropenic. Check with your caregiver for guidelines.  Exercise daily as tolerated.  Avoid people who were vaccinated with a live vaccine in the past 30 days. You should not receive live vaccines (polio, typhoid).  Do not provide direct care for pets. Avoid animal droppings. Do not clean litter boxes and bird cages.  Do not share food utensils.  Do not use tampons, enemas, or rectal suppositories unless directed by your caregiver.  Use an electric razor to remove hair.  Wash your hands after handling magazines, letters, and newspapers. SEEK IMMEDIATE MEDICAL CARE IF:    You have a fever.  You have chills or start to shake.  You feel nauseous or vomit.  You develop mouth sores.  You develop aches and pains.  You have redness and swelling around open wounds.  Your skin is warm to the touch.  You have pus coming from your wounds.  You develop swollen lymph nodes.  You feel weak or fatigued.  You develop red streaks on the skin. MAKE SURE YOU:  Understand these instructions.  Will watch your condition.  Will get help right away if you are not doing well or get worse. Document Released: 04/17/2002 Document Revised: 01/18/2012 Document Reviewed: 05/15/2011 [facility] Patient Information 2014 [place], [place].  Food Safety for the Immunocompromised Person Food safety is important for people who are immunocompromised. Immunocompromised means that the immune system is impaired or weakened (as by drugs or illness). This diet is often recommended before and after certain cancer treatments and after an organ or bone marrow transplant. It is important to follow these food safety guidelines for as long as your dietitian or caregiver instructs you to. These food safety guidelines are sometimes called the neutropenic diet or low-microbial diet. Bacteria and other harmful microorganisms are more likely to be present in raw or fresh foods. Thoroughly cooking foods destroys these microorganisms. For example, fresh vegetables should be cooked until tender; meats should be cooked until well-done; and eggs should be cooked until the yolks are  firm. Also, certain food products are treated with a method known as pasteurization. Pasteurization briefly exposes food to high heat that kills any bacteria. Look for dairy products, juices, and ciders that have the word "pasteurized" on the label.  GENERAL GUIDELINES  Check expiration dates on all products before you buy them. Nothing you buy should be past the expiration date.  Wash the following items with soap and hot  water before and after touching food:  Countertops.  Cutting boards (wash these in a dishwasher if you have one).  All cooking utensils.  All silverware.  All pots and pans.  Before preparing food, wash your hands frequently with warm, soapy water and dry your hands with paper towels. This is especially important after touching raw meat, eggs, and fish.  Wash dishes in hot, soapy water or in a dishwasher. Air dry dishes. Do not use a cloth towel.  Keep perishable food very hot or very cold. Do not leave perishable items at room temperature for more than 10 15 minutes.  All perishable foods should be cooked thoroughly. No rare meat should be eaten.  Wash fruits and vegetables thoroughly under cold running water before peeling or cutting. Individually scrub produce that has a thick, rough skin or rind, such as lettuce, spinach, or cabbage. Do not use commercial rinses to wash fruits and vegetables.   Packaged salads, slaw mix, and other prepared produce (even marked "prewashed") should be rinsed again under cold running water.   If consuming unpasteurized or fresh tofu, cut tofu into 1 inch (or smaller) cubes. Then, boil the tofu for at least 5 minutes in water or broth before eating or using the tofu in recipes.  Use distilled or bottled water if you are using a water service other than the city water service.  Thaw frozen foods in the refrigerator overnight or quickly in the microwave. Do not thaw food on the countertop.  Refrigerate leftovers promptly in airtight containers.  Use leftovers only if they have been stored properly and have been around for no more than 24 hours.  When food shopping, avoid salad bars, bulk food bins, food samples, and snacks that are out in the open.  When dining out:  Avoid salad bars, delis, and buffets.  Use single-serve condiments, such as ketchup, mustard, mayonnaise, soy sauce, steak sauce, salt, pepper, and sugar. Check with your  caregiver after blood work is done to see when your neutropenic diet can be modified with fewer restrictions. SPECIFIC EXAMPLE GUIDELINES Beverages  Allowed: Boiled well water. Bottled spring, distilled, and natural water. Tap water and ice made from bottled or tap water. All canned, bottled, powdered beverages. Instant and brewed coffee, tea; cold-brewed tea made with boiling water. Brewed herbal teas using commercially-packaged tea bags. Liquid and powdered commercial nutritional supplements. Other beverages not listed below.   Avoid: Unboiled well water.Cold-brewed tea made with warm or cold water. Raw, unpasteurized milk. Unpasteurized fruit and vegetable juices. Mat tea. Eggnog or milkshakes made with raw eggs. Fresh apple cider.  Meat, Fish, Eggs, Poultry  Allowed: All thoroughly-cooked or canned meats: beef, pork, lamb, poultry, fish, shellfish, game, ham, bacon, sausage, hot dogs. Thoroughly cooked pasteurized egg substitutes and eggs (egg white cooked firm with thickened yellow yolk acceptable). Commercially packaged salami, bologna, and other luncheon meats. Hot dogs should be heated until steaming (165F [73.9C]). Cooked tofu. Prepackaged peanut butter.  Avoid: Uncooked or rare meat, fish, eggs, or poultry. Commercially prepared meat and fish salads. Sushi. Raw or undercooked  meat, poultry, fish, game, tofu. Raw or undercooked eggs and egg substitutes. Unheated meats and cold cuts from the deli. Hard cured salami in natural wrap. Cold-smoked salmon, lox. Pickled fish. [place] products. Dairy Products  Allowed: Pasteurized, grade "A" milk or milk products or lactose-free milk or yogurt. Pasteurized yogurt or frozen yogurt. Prepackaged ice cream, sherbet, ice cream bars, homemade milkshakes. Prepackaged and pasteurized hard cheeses, such as cheddar, [place], [place], or Swiss. Prepackaged soft cheeses, such as cottage cheese, cream cheese, or ricotta. Dry, refrigerated, and frozen  pasteurized whipped topping. Commercial nutritional supplements. Pasteurized eggnog. Pasteurized sour cream.  Avoid: Soft-serve ice cream or frozen yogurt. Hand-packed ice cream or frozen yogurt. Feta, brie, camembert, blue, gorgonzola, Stilton, Roquefort, farmer's cheese, and queso fresco cheeses. Any imported cheeses and any cheese sliced at a deli.Unpasteurized or raw milk cheese, yogurt, and other milk products. Cheeses containing chili peppers or other uncooked vegetables. Breads, Cereals, Rice, Potatoes, Pasta  Allowed: All prepackaged or homemade breads, bagels, rolls, pancakes, sweet rolls, waffles, [place] toast, muffins, cakes, donuts, cookies, crackers. All boxed hot or cold cereals. Cooked potatoes, rice, noodles, other grains. Potato chips, corn chips, tortilla chips, pretzels, popcorn.  Avoid: Fresh bakery breads, muffins, cakes, donuts, cream or custard filled cakes. Raw grain products. Vegetables and Fruits  Allowed: All frozen, canned, and washed raw vegetables that have been cooked. All cooked or canned fruits. Raw, well-washed and non-bruised fruits. Pasteurized fruit juices. Canned and stewed fruit. Dried fruits.  Avoid: Unwashedraw vegetables and salads. All raw vegetable sprouts (alfalfa, radish, broccoli, mung bean, all others). Salads from the deli.Prepackaged salsas stored in refrigerated case. Unwashed raw fruits. Unpasteurized fruit and vegetable juices. Nuts  Allowed: Processed peanut butter. Canned or bottled roasted nuts. Nuts in baked products.  Avoid:Unroasted raw nuts. Unprocessed nuts. Roasted nuts in the shell. Condiments and Spices  Allowed: All cooked, fresh, or canned spices (add at least 5 minutes before cooking ends). Thoroughly washed fresh herbs and spices. Ketchup, mustard, BBQ sauce, soy sauce, and mayonnaise served in separate containers with clean utensils, refrigerated after opening. Sugar, jelly, and honey served from clean containers with  clean utensils.   Avoid: Uncooked spices. Raw honey. Anything from a family container that is not freshly washed.  Desserts  Allowed:Refrigerated commercial and homemade cakes, pies, pastries, and pudding. Refrigerated cream-filled pastries. Homemade and commercial cookies. Shelf-stable cream-filled cupcakes, fruit pies, and canned pudding. Ices, popsicle-like products.  Avoid: Unrefrigerated, cream-filled pastry products (not shelf-stable). Fats  Allowed: Oil, shortening, refrigerated lard, margarine, butter. Commercial or shelf-stable mayonnaise and salad dressings (including cheese-based salad dressings, refrigerated after opening). Cooked gravy and sauces.  Avoid: Fresh salad dressings containing aged cheese (blue cheese, Roquefort) or raw eggs, stored in refrigerated case. Restaurant Foods and Miscellaneous  Allowed:Thoroughly cooked frozen dinners. Thoroughly cooked frozen pizza. Canned entrees.   Avoid: Eating at restaurants while in neutropenia or using take-out deli food even if it is behind the counter. Avoid all salad bars while you are neutropenic. Avoid all self-serve buffets while you are neutropenic.  Ask your caregiver for information or recommendations regarding poor appetite and weight loss during cancer treatment if needed.  Document Released: 08/23/2007 Document Revised: 04/26/2012 Document Reviewed: 03/11/2012 [facility] Patient Information 2014 [place].

## 2014-01-15 ENCOUNTER — Ambulatory Visit (HOSPITAL_BASED_OUTPATIENT_CLINIC_OR_DEPARTMENT_OTHER): Payer: Managed Care, Other (non HMO)

## 2014-01-15 ENCOUNTER — Other Ambulatory Visit (HOSPITAL_BASED_OUTPATIENT_CLINIC_OR_DEPARTMENT_OTHER): Payer: Managed Care, Other (non HMO)

## 2014-01-15 VITALS — BP 135/80 | HR 79 | Temp 98.4°F | Resp 17

## 2014-01-15 DIAGNOSIS — C6290 Malignant neoplasm of unspecified testis, unspecified whether descended or undescended: Secondary | ICD-10-CM

## 2014-01-15 DIAGNOSIS — Z5111 Encounter for antineoplastic chemotherapy: Secondary | ICD-10-CM

## 2014-01-15 LAB — CBC WITH DIFFERENTIAL/PLATELET
BASO%: 0.7 % (ref 0.0–2.0)
Basophils Absolute: 0.1 10e3/uL (ref 0.0–0.1)
EOS%: 0 % (ref 0.0–7.0)
Eosinophils Absolute: 0 10e3/uL (ref 0.0–0.5)
HCT: 38.1 % — ABNORMAL LOW (ref 38.4–49.9)
HGB: 13 g/dL (ref 13.0–17.1)
LYMPH%: 17.6 % (ref 14.0–49.0)
MCH: 28.8 pg (ref 27.2–33.4)
MCHC: 34.1 g/dL (ref 32.0–36.0)
MCV: 84.3 fL (ref 79.3–98.0)
MONO#: 1 10e3/uL — ABNORMAL HIGH (ref 0.1–0.9)
MONO%: 12.9 % (ref 0.0–14.0)
NEUT#: 5.5 10e3/uL (ref 1.5–6.5)
NEUT%: 68.8 % (ref 39.0–75.0)
Platelets: 292 10e3/uL (ref 140–400)
RBC: 4.52 10e6/uL (ref 4.20–5.82)
RDW: 14.3 % (ref 11.0–14.6)
WBC: 8.1 10e3/uL (ref 4.0–10.3)
lymph#: 1.4 10e3/uL (ref 0.9–3.3)
nRBC: 0 % (ref 0–0)

## 2014-01-15 LAB — COMPREHENSIVE METABOLIC PANEL (CC13)
ALT: 26 U/L (ref 0–55)
AST: 20 U/L (ref 5–34)
Albumin: 3.8 g/dL (ref 3.5–5.0)
Alkaline Phosphatase: 65 U/L (ref 40–150)
Anion Gap: 9 meq/L (ref 3–11)
BUN: 10.9 mg/dL (ref 7.0–26.0)
CO2: 24 meq/L (ref 22–29)
Calcium: 9.4 mg/dL (ref 8.4–10.4)
Chloride: 109 meq/L (ref 98–109)
Creatinine: 1 mg/dL (ref 0.7–1.3)
Glucose: 95 mg/dL (ref 70–140)
Potassium: 4.3 meq/L (ref 3.5–5.1)
Sodium: 142 meq/L (ref 136–145)
Total Bilirubin: 0.38 mg/dL (ref 0.20–1.20)
Total Protein: 7.1 g/dL (ref 6.4–8.3)

## 2014-01-15 LAB — TECHNOLOGIST REVIEW

## 2014-01-15 MED ORDER — PALONOSETRON HCL INJECTION 0.25 MG/5ML
0.2500 mg | Freq: Once | INTRAVENOUS | Status: AC
  Administered 2014-01-15: 0.25 mg via INTRAVENOUS

## 2014-01-15 MED ORDER — POTASSIUM CHLORIDE 2 MEQ/ML IV SOLN
Freq: Once | INTRAVENOUS | Status: AC
  Administered 2014-01-15: 10:00:00 via INTRAVENOUS
  Filled 2014-01-15: qty 10

## 2014-01-15 MED ORDER — SODIUM CHLORIDE 0.9 % IV SOLN
Freq: Once | INTRAVENOUS | Status: AC
  Administered 2014-01-15: 09:00:00 via INTRAVENOUS

## 2014-01-15 MED ORDER — SODIUM CHLORIDE 0.9 % IV SOLN
150.0000 mg | Freq: Once | INTRAVENOUS | Status: AC
  Administered 2014-01-15: 150 mg via INTRAVENOUS
  Filled 2014-01-15: qty 5

## 2014-01-15 MED ORDER — ETOPOSIDE CHEMO INJECTION 1 GM/50ML
100.0000 mg/m2 | Freq: Once | INTRAVENOUS | Status: AC
  Administered 2014-01-15: 220 mg via INTRAVENOUS
  Filled 2014-01-15: qty 11

## 2014-01-15 MED ORDER — SODIUM CHLORIDE 0.9 % IJ SOLN
10.0000 mL | INTRAMUSCULAR | Status: DC | PRN
  Administered 2014-01-15: 10 mL
  Filled 2014-01-15: qty 10

## 2014-01-15 MED ORDER — HEPARIN SOD (PORK) LOCK FLUSH 100 UNIT/ML IV SOLN
500.0000 [IU] | Freq: Once | INTRAVENOUS | Status: AC | PRN
  Administered 2014-01-15: 500 [IU]
  Filled 2014-01-15: qty 5

## 2014-01-15 MED ORDER — DEXAMETHASONE SODIUM PHOSPHATE 20 MG/5ML IJ SOLN
INTRAMUSCULAR | Status: AC
  Filled 2014-01-15: qty 5

## 2014-01-15 MED ORDER — SODIUM CHLORIDE 0.9 % IV SOLN
20.0000 mg/m2 | Freq: Once | INTRAVENOUS | Status: AC
  Administered 2014-01-15: 45 mg via INTRAVENOUS
  Filled 2014-01-15: qty 45

## 2014-01-15 MED ORDER — DEXAMETHASONE SODIUM PHOSPHATE 20 MG/5ML IJ SOLN
12.0000 mg | Freq: Once | INTRAMUSCULAR | Status: AC
  Administered 2014-01-15: 12 mg via INTRAVENOUS

## 2014-01-15 MED ORDER — PALONOSETRON HCL INJECTION 0.25 MG/5ML
INTRAVENOUS | Status: AC
  Filled 2014-01-15: qty 5

## 2014-01-15 NOTE — Patient Instructions (Signed)
[facility] Discharge Instructions for Patients Receiving Chemotherapy  Today you received the following chemotherapy agents VP-16 (Etoposide) and Cisplatin.  To help prevent nausea and vomiting after your treatment, we encourage you to take your nausea medication.   If you develop nausea and vomiting that is not controlled by your nausea medication, call the clinic.   BELOW ARE SYMPTOMS THAT SHOULD BE REPORTED IMMEDIATELY:  *FEVER GREATER THAN 100.5 F  *CHILLS WITH OR WITHOUT FEVER  NAUSEA AND VOMITING THAT IS NOT CONTROLLED WITH YOUR NAUSEA MEDICATION  *UNUSUAL SHORTNESS OF BREATH  *UNUSUAL BRUISING OR BLEEDING  TENDERNESS IN MOUTH AND THROAT WITH OR WITHOUT PRESENCE OF ULCERS  *URINARY PROBLEMS  *BOWEL PROBLEMS  UNUSUAL RASH Items with * indicate a potential emergency and should be followed up as soon as possible.  Feel free to call the clinic you have any questions or concerns. The clinic phone number is [phone].

## 2014-01-16 ENCOUNTER — Ambulatory Visit (HOSPITAL_BASED_OUTPATIENT_CLINIC_OR_DEPARTMENT_OTHER): Payer: Managed Care, Other (non HMO)

## 2014-01-16 VITALS — BP 122/68 | HR 82 | Temp 97.0°F | Resp 20

## 2014-01-16 DIAGNOSIS — Z5111 Encounter for antineoplastic chemotherapy: Secondary | ICD-10-CM

## 2014-01-16 DIAGNOSIS — C6290 Malignant neoplasm of unspecified testis, unspecified whether descended or undescended: Secondary | ICD-10-CM

## 2014-01-16 MED ORDER — POTASSIUM CHLORIDE 2 MEQ/ML IV SOLN
Freq: Once | INTRAVENOUS | Status: AC
  Administered 2014-01-16: 08:00:00 via INTRAVENOUS
  Filled 2014-01-16: qty 10

## 2014-01-16 MED ORDER — DEXAMETHASONE SODIUM PHOSPHATE 20 MG/5ML IJ SOLN
INTRAMUSCULAR | Status: AC
  Filled 2014-01-16: qty 5

## 2014-01-16 MED ORDER — SODIUM CHLORIDE 0.9 % IV SOLN
Freq: Once | INTRAVENOUS | Status: AC
  Administered 2014-01-16: 08:00:00 via INTRAVENOUS

## 2014-01-16 MED ORDER — BLEOMYCIN SULFATE CHEMO INJECTION 30 UNIT
30.0000 [IU] | Freq: Once | INTRAMUSCULAR | Status: AC
  Administered 2014-01-16: 30 [IU] via INTRAVENOUS
  Filled 2014-01-16: qty 10

## 2014-01-16 MED ORDER — SODIUM CHLORIDE 0.9 % IJ SOLN
10.0000 mL | INTRAMUSCULAR | Status: DC | PRN
  Administered 2014-01-16: 10 mL
  Filled 2014-01-16: qty 10

## 2014-01-16 MED ORDER — HEPARIN SOD (PORK) LOCK FLUSH 100 UNIT/ML IV SOLN
500.0000 [IU] | Freq: Once | INTRAVENOUS | Status: AC | PRN
  Administered 2014-01-16: 500 [IU]
  Filled 2014-01-16: qty 5

## 2014-01-16 MED ORDER — DEXAMETHASONE SODIUM PHOSPHATE 20 MG/5ML IJ SOLN
20.0000 mg | Freq: Once | INTRAMUSCULAR | Status: AC
  Administered 2014-01-16: 20 mg via INTRAVENOUS

## 2014-01-16 MED ORDER — SODIUM CHLORIDE 0.9 % IV SOLN
100.0000 mg/m2 | Freq: Once | INTRAVENOUS | Status: AC
  Administered 2014-01-16: 220 mg via INTRAVENOUS
  Filled 2014-01-16: qty 11

## 2014-01-16 MED ORDER — CISPLATIN CHEMO INJECTION 100MG/100ML
20.0000 mg/m2 | Freq: Once | INTRAVENOUS | Status: AC
  Administered 2014-01-16: 45 mg via INTRAVENOUS
  Filled 2014-01-16: qty 45

## 2014-01-16 NOTE — Patient Instructions (Signed)
[facility] Discharge Instructions for Patients Receiving Chemotherapy  Today you received the following chemotherapy agents bleomycin, cisplatin, etoposide  To help prevent nausea and vomiting after your treatment, we encourage you to take your nausea medication as needed.   If you develop nausea and vomiting that is not controlled by your nausea medication, call the clinic.   BELOW ARE SYMPTOMS THAT SHOULD BE REPORTED IMMEDIATELY:  *FEVER GREATER THAN 100.5 F  *CHILLS WITH OR WITHOUT FEVER  NAUSEA AND VOMITING THAT IS NOT CONTROLLED WITH YOUR NAUSEA MEDICATION  *UNUSUAL SHORTNESS OF BREATH  *UNUSUAL BRUISING OR BLEEDING  TENDERNESS IN MOUTH AND THROAT WITH OR WITHOUT PRESENCE OF ULCERS  *URINARY PROBLEMS  *BOWEL PROBLEMS  UNUSUAL RASH Items with * indicate a potential emergency and should be followed up as soon as possible.  Feel free to call the clinic you have any questions or concerns. The clinic phone number is [phone].

## 2014-01-16 NOTE — Progress Notes (Signed)
Voided 1200 before cisplatin, voided 625 after cisplatin started.

## 2014-01-17 ENCOUNTER — Ambulatory Visit (HOSPITAL_BASED_OUTPATIENT_CLINIC_OR_DEPARTMENT_OTHER): Payer: Managed Care, Other (non HMO)

## 2014-01-17 VITALS — BP 142/78 | HR 104 | Temp 96.5°F | Resp 20

## 2014-01-17 DIAGNOSIS — Z5111 Encounter for antineoplastic chemotherapy: Secondary | ICD-10-CM

## 2014-01-17 DIAGNOSIS — C6290 Malignant neoplasm of unspecified testis, unspecified whether descended or undescended: Secondary | ICD-10-CM

## 2014-01-17 MED ORDER — SODIUM CHLORIDE 0.9 % IV SOLN
Freq: Once | INTRAVENOUS | Status: AC
  Administered 2014-01-17: 08:00:00 via INTRAVENOUS

## 2014-01-17 MED ORDER — HEPARIN SOD (PORK) LOCK FLUSH 100 UNIT/ML IV SOLN
500.0000 [IU] | Freq: Once | INTRAVENOUS | Status: AC | PRN
  Administered 2014-01-17: 500 [IU]
  Filled 2014-01-17: qty 5

## 2014-01-17 MED ORDER — PALONOSETRON HCL INJECTION 0.25 MG/5ML
0.2500 mg | Freq: Once | INTRAVENOUS | Status: AC
  Administered 2014-01-17: 0.25 mg via INTRAVENOUS

## 2014-01-17 MED ORDER — PALONOSETRON HCL INJECTION 0.25 MG/5ML
INTRAVENOUS | Status: AC
  Filled 2014-01-17: qty 5

## 2014-01-17 MED ORDER — SODIUM CHLORIDE 0.9 % IV SOLN
100.0000 mg/m2 | Freq: Once | INTRAVENOUS | Status: AC
  Administered 2014-01-17: 220 mg via INTRAVENOUS
  Filled 2014-01-17: qty 11

## 2014-01-17 MED ORDER — DEXAMETHASONE SODIUM PHOSPHATE 20 MG/5ML IJ SOLN
INTRAMUSCULAR | Status: AC
  Filled 2014-01-17: qty 5

## 2014-01-17 MED ORDER — POTASSIUM CHLORIDE 2 MEQ/ML IV SOLN
Freq: Once | INTRAVENOUS | Status: AC
  Administered 2014-01-17: 08:00:00 via INTRAVENOUS
  Filled 2014-01-17: qty 10

## 2014-01-17 MED ORDER — SODIUM CHLORIDE 0.9 % IJ SOLN
10.0000 mL | INTRAMUSCULAR | Status: DC | PRN
  Administered 2014-01-17: 10 mL
  Filled 2014-01-17: qty 10

## 2014-01-17 MED ORDER — DEXAMETHASONE SODIUM PHOSPHATE 20 MG/5ML IJ SOLN
20.0000 mg | Freq: Once | INTRAMUSCULAR | Status: AC
  Administered 2014-01-17: 20 mg via INTRAVENOUS

## 2014-01-17 MED ORDER — SODIUM CHLORIDE 0.9 % IV SOLN
20.0000 mg/m2 | Freq: Once | INTRAVENOUS | Status: AC
  Administered 2014-01-17: 45 mg via INTRAVENOUS
  Filled 2014-01-17: qty 45

## 2014-01-17 NOTE — Progress Notes (Signed)
Pt voided 1175 cc prior to VP-16/cisplatin.

## 2014-01-17 NOTE — Patient Instructions (Signed)
[facility] Discharge Instructions for Patients Receiving Chemotherapy  Today you received the following chemotherapy agents: Etoposide and Cisplatin.  To help prevent nausea and vomiting after your treatment, we encourage you to take your nausea medication as prescribed.   If you develop nausea and vomiting that is not controlled by your nausea medication, call the clinic.   BELOW ARE SYMPTOMS THAT SHOULD BE REPORTED IMMEDIATELY:  *FEVER GREATER THAN 100.5 F  *CHILLS WITH OR WITHOUT FEVER  NAUSEA AND VOMITING THAT IS NOT CONTROLLED WITH YOUR NAUSEA MEDICATION  *UNUSUAL SHORTNESS OF BREATH  *UNUSUAL BRUISING OR BLEEDING  TENDERNESS IN MOUTH AND THROAT WITH OR WITHOUT PRESENCE OF ULCERS  *URINARY PROBLEMS  *BOWEL PROBLEMS  UNUSUAL RASH Items with * indicate a potential emergency and should be followed up as soon as possible.  Feel free to call the clinic you have any questions or concerns. The clinic phone number is [phone].    

## 2014-01-18 ENCOUNTER — Ambulatory Visit (HOSPITAL_BASED_OUTPATIENT_CLINIC_OR_DEPARTMENT_OTHER): Payer: Managed Care, Other (non HMO)

## 2014-01-18 VITALS — BP 136/88 | HR 96 | Temp 97.5°F | Resp 19

## 2014-01-18 DIAGNOSIS — Z5111 Encounter for antineoplastic chemotherapy: Secondary | ICD-10-CM

## 2014-01-18 DIAGNOSIS — C6290 Malignant neoplasm of unspecified testis, unspecified whether descended or undescended: Secondary | ICD-10-CM

## 2014-01-18 MED ORDER — ETOPOSIDE CHEMO INJECTION 1 GM/50ML
100.0000 mg/m2 | Freq: Once | INTRAVENOUS | Status: AC
  Administered 2014-01-18: 220 mg via INTRAVENOUS
  Filled 2014-01-18: qty 11

## 2014-01-18 MED ORDER — CISPLATIN CHEMO INJECTION 100MG/100ML
20.0000 mg/m2 | Freq: Once | INTRAVENOUS | Status: AC
  Administered 2014-01-18: 45 mg via INTRAVENOUS
  Filled 2014-01-18: qty 45

## 2014-01-18 MED ORDER — POTASSIUM CHLORIDE 2 MEQ/ML IV SOLN
Freq: Once | INTRAVENOUS | Status: AC
  Administered 2014-01-18: 08:00:00 via INTRAVENOUS
  Filled 2014-01-18: qty 10

## 2014-01-18 MED ORDER — HEPARIN SOD (PORK) LOCK FLUSH 100 UNIT/ML IV SOLN
500.0000 [IU] | Freq: Once | INTRAVENOUS | Status: AC | PRN
  Administered 2014-01-18: 500 [IU]
  Filled 2014-01-18: qty 5

## 2014-01-18 MED ORDER — DEXAMETHASONE SODIUM PHOSPHATE 20 MG/5ML IJ SOLN
INTRAMUSCULAR | Status: AC
  Filled 2014-01-18: qty 5

## 2014-01-18 MED ORDER — DEXAMETHASONE SODIUM PHOSPHATE 20 MG/5ML IJ SOLN
20.0000 mg | Freq: Once | INTRAMUSCULAR | Status: AC
  Administered 2014-01-18: 20 mg via INTRAVENOUS

## 2014-01-18 MED ORDER — SODIUM CHLORIDE 0.9 % IJ SOLN
10.0000 mL | INTRAMUSCULAR | Status: DC | PRN
  Administered 2014-01-18: 10 mL
  Filled 2014-01-18: qty 10

## 2014-01-18 MED ORDER — SODIUM CHLORIDE 0.9 % IV SOLN
Freq: Once | INTRAVENOUS | Status: AC
  Administered 2014-01-18: 08:00:00 via INTRAVENOUS

## 2014-01-18 NOTE — Progress Notes (Signed)
Patient this morning here for treatment, presents with visible intermittent shaking with legs and arms. VSS. No other complaints. States he had the shaking after his last tx as well. Reviewed with Dr. Alen Blew, ok to proceed with tx today.

## 2014-01-18 NOTE — Patient Instructions (Signed)
[facility] Discharge Instructions for Patients Receiving Chemotherapy  Today you received the following chemotherapy agents Cisplatin and VP-16 (Etoposide).  To help prevent nausea and vomiting after your treatment, we encourage you to take your nausea medication.   If you develop nausea and vomiting that is not controlled by your nausea medication, call the clinic.   BELOW ARE SYMPTOMS THAT SHOULD BE REPORTED IMMEDIATELY:  *FEVER GREATER THAN 100.5 F  *CHILLS WITH OR WITHOUT FEVER  NAUSEA AND VOMITING THAT IS NOT CONTROLLED WITH YOUR NAUSEA MEDICATION  *UNUSUAL SHORTNESS OF BREATH  *UNUSUAL BRUISING OR BLEEDING  TENDERNESS IN MOUTH AND THROAT WITH OR WITHOUT PRESENCE OF ULCERS  *URINARY PROBLEMS  *BOWEL PROBLEMS  UNUSUAL RASH Items with * indicate a potential emergency and should be followed up as soon as possible.  Feel free to call the clinic you have any questions or concerns. The clinic phone number is [phone].    

## 2014-01-19 ENCOUNTER — Other Ambulatory Visit: Payer: Self-pay | Admitting: Oncology

## 2014-01-19 ENCOUNTER — Ambulatory Visit (HOSPITAL_BASED_OUTPATIENT_CLINIC_OR_DEPARTMENT_OTHER): Payer: Managed Care, Other (non HMO)

## 2014-01-19 VITALS — BP 150/83 | HR 92 | Temp 97.0°F

## 2014-01-19 DIAGNOSIS — C6290 Malignant neoplasm of unspecified testis, unspecified whether descended or undescended: Secondary | ICD-10-CM

## 2014-01-19 DIAGNOSIS — Z5111 Encounter for antineoplastic chemotherapy: Secondary | ICD-10-CM

## 2014-01-19 MED ORDER — SODIUM CHLORIDE 0.9 % IJ SOLN
10.0000 mL | INTRAMUSCULAR | Status: DC | PRN
  Administered 2014-01-19: 10 mL
  Filled 2014-01-19: qty 10

## 2014-01-19 MED ORDER — SODIUM CHLORIDE 0.9 % IV SOLN
100.0000 mg/m2 | Freq: Once | INTRAVENOUS | Status: AC
  Administered 2014-01-19: 220 mg via INTRAVENOUS
  Filled 2014-01-19: qty 11

## 2014-01-19 MED ORDER — PALONOSETRON HCL INJECTION 0.25 MG/5ML
INTRAVENOUS | Status: AC
  Filled 2014-01-19: qty 5

## 2014-01-19 MED ORDER — DEXAMETHASONE SODIUM PHOSPHATE 20 MG/5ML IJ SOLN
INTRAMUSCULAR | Status: AC
  Filled 2014-01-19: qty 5

## 2014-01-19 MED ORDER — PALONOSETRON HCL INJECTION 0.25 MG/5ML
0.2500 mg | Freq: Once | INTRAVENOUS | Status: AC
  Administered 2014-01-19: 0.25 mg via INTRAVENOUS

## 2014-01-19 MED ORDER — POTASSIUM CHLORIDE 2 MEQ/ML IV SOLN
Freq: Once | INTRAVENOUS | Status: AC
  Administered 2014-01-19: 08:00:00 via INTRAVENOUS
  Filled 2014-01-19: qty 10

## 2014-01-19 MED ORDER — PROMETHAZINE HCL 25 MG PO TABS: 25.0000 mg | ORAL_TABLET | Freq: Four times a day (QID) | ORAL | Status: DC | PRN

## 2014-01-19 MED ORDER — SODIUM CHLORIDE 0.9 % IV SOLN
20.0000 mg/m2 | Freq: Once | INTRAVENOUS | Status: AC
  Administered 2014-01-19: 45 mg via INTRAVENOUS
  Filled 2014-01-19: qty 45

## 2014-01-19 MED ORDER — SODIUM CHLORIDE 0.9 % IV SOLN
Freq: Once | INTRAVENOUS | Status: AC
  Administered 2014-01-19: 08:00:00 via INTRAVENOUS

## 2014-01-19 MED ORDER — HEPARIN SOD (PORK) LOCK FLUSH 100 UNIT/ML IV SOLN
500.0000 [IU] | Freq: Once | INTRAVENOUS | Status: AC | PRN
  Administered 2014-01-19: 500 [IU]
  Filled 2014-01-19: qty 5

## 2014-01-19 MED ORDER — DEXAMETHASONE SODIUM PHOSPHATE 20 MG/5ML IJ SOLN
20.0000 mg | Freq: Once | INTRAMUSCULAR | Status: AC
  Administered 2014-01-19: 20 mg via INTRAVENOUS

## 2014-01-19 NOTE — Progress Notes (Signed)
Patient states he has been shaking and he talked to Dr. Alen Blew about it yesterday. Patient states he notices an increase in shaking after taking compazine. Dr. Alen Blew notified, phenergan 25 mg PO e-scribed to patient's pharmacy.

## 2014-01-19 NOTE — Patient Instructions (Signed)
[facility] Discharge Instructions for Patients Receiving Chemotherapy  Today you received the following chemotherapy agents Cisplatin/VP 16 (Etoposide) To help prevent nausea and vomiting after your treatment, we encourage you to take your nausea medication as prescribed.   If you develop nausea and vomiting that is not controlled by your nausea medication, call the clinic.   BELOW ARE SYMPTOMS THAT SHOULD BE REPORTED IMMEDIATELY:  *FEVER GREATER THAN 100.5 F  *CHILLS WITH OR WITHOUT FEVER  NAUSEA AND VOMITING THAT IS NOT CONTROLLED WITH YOUR NAUSEA MEDICATION  *UNUSUAL SHORTNESS OF BREATH  *UNUSUAL BRUISING OR BLEEDING  TENDERNESS IN MOUTH AND THROAT WITH OR WITHOUT PRESENCE OF ULCERS  *URINARY PROBLEMS  *BOWEL PROBLEMS  UNUSUAL RASH Items with * indicate a potential emergency and should be followed up as soon as possible.  Feel free to call the clinic you have any questions or concerns. The clinic phone number is [phone].    

## 2014-01-23 ENCOUNTER — Ambulatory Visit (HOSPITAL_BASED_OUTPATIENT_CLINIC_OR_DEPARTMENT_OTHER): Payer: Managed Care, Other (non HMO)

## 2014-01-23 ENCOUNTER — Other Ambulatory Visit: Payer: Self-pay | Admitting: *Deleted

## 2014-01-23 ENCOUNTER — Ambulatory Visit: Payer: Managed Care, Other (non HMO)

## 2014-01-23 ENCOUNTER — Other Ambulatory Visit (HOSPITAL_BASED_OUTPATIENT_CLINIC_OR_DEPARTMENT_OTHER): Payer: Managed Care, Other (non HMO)

## 2014-01-23 VITALS — BP 120/82 | HR 90 | Temp 98.3°F | Resp 16

## 2014-01-23 DIAGNOSIS — C6290 Malignant neoplasm of unspecified testis, unspecified whether descended or undescended: Secondary | ICD-10-CM

## 2014-01-23 DIAGNOSIS — E291 Testicular hypofunction: Secondary | ICD-10-CM

## 2014-01-23 DIAGNOSIS — Z5111 Encounter for antineoplastic chemotherapy: Secondary | ICD-10-CM

## 2014-01-23 LAB — CBC WITH DIFFERENTIAL/PLATELET
BASO%: 0.4 % (ref 0.0–2.0)
Basophils Absolute: 0 10*3/uL (ref 0.0–0.1)
EOS%: 0.6 % (ref 0.0–7.0)
Eosinophils Absolute: 0 10*3/uL (ref 0.0–0.5)
HCT: 36.9 % — ABNORMAL LOW (ref 38.4–49.9)
HGB: 12.7 g/dL — ABNORMAL LOW (ref 13.0–17.1)
LYMPH%: 12.4 % — AB (ref 14.0–49.0)
MCH: 28.6 pg (ref 27.2–33.4)
MCHC: 34.4 g/dL (ref 32.0–36.0)
MCV: 83.1 fL (ref 79.3–98.0)
MONO#: 0.1 10*3/uL (ref 0.1–0.9)
MONO%: 1.8 % (ref 0.0–14.0)
NEUT#: 4.6 10*3/uL (ref 1.5–6.5)
NEUT%: 84.8 % — ABNORMAL HIGH (ref 39.0–75.0)
NRBC: 0 % (ref 0–0)
PLATELETS: 176 10*3/uL (ref 140–400)
RBC: 4.44 10*6/uL (ref 4.20–5.82)
RDW: 14.1 % (ref 11.0–14.6)
WBC: 5.4 10*3/uL (ref 4.0–10.3)
lymph#: 0.7 10*3/uL — ABNORMAL LOW (ref 0.9–3.3)

## 2014-01-23 LAB — COMPREHENSIVE METABOLIC PANEL (CC13)
ALT: 25 U/L (ref 0–55)
AST: 17 U/L (ref 5–34)
Albumin: 3.5 g/dL (ref 3.5–5.0)
Alkaline Phosphatase: 56 U/L (ref 40–150)
Anion Gap: 11 mEq/L (ref 3–11)
BILIRUBIN TOTAL: 0.43 mg/dL (ref 0.20–1.20)
BUN: 15.5 mg/dL (ref 7.0–26.0)
CALCIUM: 8.9 mg/dL (ref 8.4–10.4)
CHLORIDE: 103 meq/L (ref 98–109)
CO2: 22 mEq/L (ref 22–29)
Creatinine: 1 mg/dL (ref 0.7–1.3)
Glucose: 133 mg/dl (ref 70–140)
Potassium: 3.9 mEq/L (ref 3.5–5.1)
Sodium: 136 mEq/L (ref 136–145)
Total Protein: 6.8 g/dL (ref 6.4–8.3)

## 2014-01-23 MED ORDER — SODIUM CHLORIDE 0.9 % IV SOLN
30.0000 [IU] | Freq: Once | INTRAVENOUS | Status: AC
  Administered 2014-01-23: 30 [IU] via INTRAVENOUS
  Filled 2014-01-23: qty 10

## 2014-01-23 MED ORDER — HEPARIN SOD (PORK) LOCK FLUSH 100 UNIT/ML IV SOLN
500.0000 [IU] | Freq: Once | INTRAVENOUS | Status: AC | PRN
  Administered 2014-01-23: 500 [IU]
  Filled 2014-01-23: qty 5

## 2014-01-23 MED ORDER — PROCHLORPERAZINE MALEATE 10 MG PO TABS
ORAL_TABLET | ORAL | Status: AC
  Filled 2014-01-23: qty 1

## 2014-01-23 MED ORDER — SODIUM CHLORIDE 0.9 % IJ SOLN
10.0000 mL | INTRAMUSCULAR | Status: DC | PRN
  Administered 2014-01-23: 10 mL
  Filled 2014-01-23: qty 10

## 2014-01-23 MED ORDER — ONDANSETRON 8 MG/50ML IVPB (CHCC)
8.0000 mg | Freq: Once | INTRAVENOUS | Status: AC
  Administered 2014-01-23: 8 mg via INTRAVENOUS

## 2014-01-23 MED ORDER — SODIUM CHLORIDE 0.9 % IV SOLN
Freq: Once | INTRAVENOUS | Status: AC
  Administered 2014-01-23: 10:00:00 via INTRAVENOUS

## 2014-01-23 MED ORDER — ONDANSETRON HCL 8 MG PO TABS: 8.0000 mg | ORAL_TABLET | Freq: Three times a day (TID) | ORAL | Status: DC | PRN

## 2014-01-23 MED ORDER — ONDANSETRON 8 MG/NS 50 ML IVPB
INTRAVENOUS | Status: AC
  Filled 2014-01-23: qty 8

## 2014-01-30 ENCOUNTER — Other Ambulatory Visit: Payer: Self-pay | Admitting: *Deleted

## 2014-01-30 ENCOUNTER — Telehealth: Payer: Self-pay | Admitting: Oncology

## 2014-01-30 ENCOUNTER — Encounter: Payer: Self-pay | Admitting: Oncology

## 2014-01-30 ENCOUNTER — Other Ambulatory Visit (HOSPITAL_BASED_OUTPATIENT_CLINIC_OR_DEPARTMENT_OTHER): Payer: Managed Care, Other (non HMO)

## 2014-01-30 ENCOUNTER — Ambulatory Visit (HOSPITAL_BASED_OUTPATIENT_CLINIC_OR_DEPARTMENT_OTHER): Payer: Managed Care, Other (non HMO)

## 2014-01-30 ENCOUNTER — Ambulatory Visit (HOSPITAL_BASED_OUTPATIENT_CLINIC_OR_DEPARTMENT_OTHER): Payer: Managed Care, Other (non HMO) | Admitting: Oncology

## 2014-01-30 VITALS — BP 153/82 | HR 78 | Temp 98.1°F | Resp 20

## 2014-01-30 DIAGNOSIS — C6290 Malignant neoplasm of unspecified testis, unspecified whether descended or undescended: Secondary | ICD-10-CM

## 2014-01-30 DIAGNOSIS — Z5111 Encounter for antineoplastic chemotherapy: Secondary | ICD-10-CM

## 2014-01-30 LAB — CBC WITH DIFFERENTIAL/PLATELET
BASO%: 3 % — AB (ref 0.0–2.0)
Basophils Absolute: 0.1 10*3/uL (ref 0.0–0.1)
EOS%: 1.2 % (ref 0.0–7.0)
Eosinophils Absolute: 0 10*3/uL (ref 0.0–0.5)
HEMATOCRIT: 32.6 % — AB (ref 38.4–49.9)
HGB: 11 g/dL — ABNORMAL LOW (ref 13.0–17.1)
LYMPH%: 41.8 % (ref 14.0–49.0)
MCH: 28.1 pg (ref 27.2–33.4)
MCHC: 33.7 g/dL (ref 32.0–36.0)
MCV: 83.4 fL (ref 79.3–98.0)
MONO#: 0.2 10*3/uL (ref 0.1–0.9)
MONO%: 12.7 % (ref 0.0–14.0)
NEUT#: 0.7 10*3/uL — ABNORMAL LOW (ref 1.5–6.5)
NEUT%: 41.3 % (ref 39.0–75.0)
PLATELETS: 100 10*3/uL — AB (ref 140–400)
RBC: 3.91 10*6/uL — AB (ref 4.20–5.82)
RDW: 14.6 % (ref 11.0–14.6)
WBC: 1.7 10*3/uL — ABNORMAL LOW (ref 4.0–10.3)
lymph#: 0.7 10*3/uL — ABNORMAL LOW (ref 0.9–3.3)

## 2014-01-30 LAB — COMPREHENSIVE METABOLIC PANEL (CC13)
ALT: 19 U/L (ref 0–55)
AST: 12 U/L (ref 5–34)
Albumin: 3.5 g/dL (ref 3.5–5.0)
Alkaline Phosphatase: 58 U/L (ref 40–150)
Anion Gap: 11 mEq/L (ref 3–11)
BILIRUBIN TOTAL: 0.25 mg/dL (ref 0.20–1.20)
BUN: 12.8 mg/dL (ref 7.0–26.0)
CO2: 20 mEq/L — ABNORMAL LOW (ref 22–29)
CREATININE: 0.9 mg/dL (ref 0.7–1.3)
Calcium: 9.2 mg/dL (ref 8.4–10.4)
Chloride: 110 mEq/L — ABNORMAL HIGH (ref 98–109)
Glucose: 117 mg/dl (ref 70–140)
Potassium: 3.9 mEq/L (ref 3.5–5.1)
Sodium: 141 mEq/L (ref 136–145)
Total Protein: 6.5 g/dL (ref 6.4–8.3)

## 2014-01-30 MED ORDER — LORAZEPAM 1 MG PO TABS: 1.0000 mg | ORAL_TABLET | Freq: Three times a day (TID) | ORAL | Status: DC

## 2014-01-30 MED ORDER — SODIUM CHLORIDE 0.9 % IJ SOLN
10.0000 mL | INTRAMUSCULAR | Status: DC | PRN
  Administered 2014-01-30: 10 mL
  Filled 2014-01-30: qty 10

## 2014-01-30 MED ORDER — HEPARIN SOD (PORK) LOCK FLUSH 100 UNIT/ML IV SOLN
500.0000 [IU] | Freq: Once | INTRAVENOUS | Status: AC | PRN
  Administered 2014-01-30: 500 [IU]
  Filled 2014-01-30: qty 5

## 2014-01-30 MED ORDER — SODIUM CHLORIDE 0.9 % IV SOLN
30.0000 [IU] | Freq: Once | INTRAVENOUS | Status: AC
  Administered 2014-01-30: 30 [IU] via INTRAVENOUS
  Filled 2014-01-30: qty 10

## 2014-01-30 MED ORDER — SODIUM CHLORIDE 0.9 % IV SOLN
Freq: Once | INTRAVENOUS | Status: AC
  Administered 2014-01-30: 10:00:00 via INTRAVENOUS

## 2014-01-30 NOTE — Telephone Encounter (Signed)
[facility] ADN PRINTED APPT SCHED AND AVS FOR PT FOR MAY....[facility] PT BARIUM °

## 2014-01-30 NOTE — Patient Instructions (Signed)
[facility] Discharge Instructions for Patients Receiving Chemotherapy  Today you received the following chemotherapy agents; Bleomycin.   To help prevent nausea and vomiting after your treatment, we encourage you to take your nausea medication as directed.    If you develop nausea and vomiting that is not controlled by your nausea medication, call the clinic.   BELOW ARE SYMPTOMS THAT SHOULD BE REPORTED IMMEDIATELY:  *FEVER GREATER THAN 100.5 F  *CHILLS WITH OR WITHOUT FEVER  NAUSEA AND VOMITING THAT IS NOT CONTROLLED WITH YOUR NAUSEA MEDICATION  *UNUSUAL SHORTNESS OF BREATH  *UNUSUAL BRUISING OR BLEEDING  TENDERNESS IN MOUTH AND THROAT WITH OR WITHOUT PRESENCE OF ULCERS  *URINARY PROBLEMS  *BOWEL PROBLEMS  UNUSUAL RASH Items with * indicate a potential emergency and should be followed up as soon as possible.  Feel free to call the clinic you have any questions or concerns. The clinic phone number is [phone].

## 2014-01-30 NOTE — Progress Notes (Signed)
Pt took one zofran 8 mg PO at home this morning and is allergic to compazine.  Per Dr. Alen Blew, no need to give any further pre meds prior to starting Bleomycin today.

## 2014-01-30 NOTE — Progress Notes (Signed)
Hematology and Oncology Follow Up Visit  Roberto Ruiz [id number] 13-Jun-1966 48 y.o. 01/30/2014 8:48 AM [name], MDBouska, [name], MD   Principle Diagnosis: 48 year old gentleman with stage IB nonseminomatous testis cancer presented with a T2 N0 mass in his left testicle. The pathology revealed mixed histology with 60% seminoma, 40% embryonal. Lymphovascular invasion was identified.   Prior Therapy: He is status post left orchiectomy done on 11/13/2013.  Current therapy: He is currently receiving adjuvant chemotherapy in the form of BEP today is day 16 cycle 2.   Interim History: Patient presents today for a followup visit. He tolerated the second cycle of chemotherapy without any major complications. He developed a grade 2 nausea, grade 1 fatigue, and generalized tiredness. He did one episode of vomiting after the completion of the first week of cycle 2. He has reported some tremors associated with Compazine and improved upon discontinuation of this medication. Did not report any peripheral neuropathy. Did not report any shortness of breath or difficulty breathing. Does report occasional cough but no exertional dyspnea. Did not report any hemoptysis or hematemesis. He is able to perform most activities of daily living without any hindrance or decline. Did not have any hospitalizations or illnesses. He has felt a lot better the last few days and ready to proceed with chemotherapy.  Medications: I have reviewed the patient's current medications.  Current Outpatient Prescriptions  Medication Sig Dispense Refill  . famotidine-calcium carbonate-magnesium hydroxide (PEPCID COMPLETE) [id number] MG CHEW chewable tablet Chew 1 tablet by mouth daily as needed.      Marland Kitchen ibuprofen (ADVIL,MOTRIN) 200 MG tablet Take 200 mg by mouth every 6 (six) hours as needed for mild pain or moderate pain.      Marland Kitchen lidocaine-prilocaine (EMLA) cream Apply 1 application topically as needed.  30 g  0  . ondansetron  (ZOFRAN) 8 MG tablet Take 1 tablet (8 mg total) by mouth every 8 (eight) hours as needed for nausea or vomiting.  20 tablet  0  . promethazine (PHENERGAN) 25 MG tablet Take 1 tablet (25 mg total) by mouth every 6 (six) hours as needed for nausea or vomiting.  30 tablet  1   No current facility-administered medications for this visit.     Allergies:  Allergies  Allergen Reactions  . Compazine [Prochlorperazine Edisylate]     Caused sever shaking and leg cramps  . Sulfa Antibiotics Hives    Past Medical History, Surgical history, Social history, and Family History were reviewed and updated.  Review of Systems: Constitutional:  Negative for fever, chills, night sweats, anorexia, weight loss, pain. Cardiovascular: no chest pain or dyspnea on exertion Respiratory: no cough, shortness of breath, or wheezing Neurological: no TIA or stroke symptoms Dermatological: negative for pruritus and rash ENT: negative for - headaches, hearing change or nasal congestion Skin: Negative. Gastrointestinal: no abdominal pain, change in bowel habits, or black or bloody stools Genito-Urinary: no dysuria, trouble voiding, or hematuria Hematological and Lymphatic: negative for - bleeding problems, night sweats, pallor or swollen lymph nodes Breast: negative Musculoskeletal: negative for - gait disturbance, muscle pain or muscular weakness Remaining ROS negative. Physical Exam: Blood pressure 153/82, pulse 78, temperature 98.1 F (36.7 C), temperature source Oral, resp. rate 20. ECOG: 0 General appearance: alert, cooperative and appears stated age Head: Normocephalic, without obvious abnormality, atraumatic Neck: no adenopathy, no carotid bruit, no JVD, supple, symmetrical, trachea midline and thyroid not enlarged, symmetric, no tenderness/mass/nodules Lymph nodes: Cervical, supraclavicular, and axillary nodes normal. Heart:regular rate and  rhythm, S1, S2 normal, no murmur, click, rub or  gallop Lung:chest clear, no wheezing, rales, normal symmetric air entry Abdomin: soft, non-tender, without masses or organomegaly EXT:no erythema, induration, or nodules   Lab Results: Lab Results  Component Value Date   WBC 5.4 01/23/2014   HGB 12.7* 01/23/2014   HCT 36.9* 01/23/2014   MCV 83.1 01/23/2014   PLT 176 01/23/2014     Chemistry      Component Value Date/Time   NA 136 01/23/2014 0914   NA 132* 12/26/2013 0936   K 3.9 01/23/2014 0914   K 4.1 12/26/2013 0936   CL 99 12/26/2013 0936   CO2 22 01/23/2014 0914   CO2 22 12/26/2013 0936   BUN 15.5 01/23/2014 0914   BUN 14 12/26/2013 0936   CREATININE 1.0 01/23/2014 0914   CREATININE 1.05 12/26/2013 0936      Component Value Date/Time   CALCIUM 8.9 01/23/2014 0914   CALCIUM 8.7 12/26/2013 0936   ALKPHOS 56 01/23/2014 0914   ALKPHOS 46 12/26/2013 0936   AST 17 01/23/2014 0914   AST 23 12/26/2013 0936   ALT 25 01/23/2014 0914   ALT 36 12/26/2013 0936   BILITOT 0.43 01/23/2014 0914   BILITOT 0.5 12/26/2013 0936       Impression and Plan:  48 year old gentleman with the following issues:  1. Nonseminomatous testicular cancer presented with a T2 N0 stage IB disease. He has a 40% embryonal component was lymphovascular invasion. He is receiving adjuvant chemotherapy in the form of BEP. He does not report any major complications are noted the to proceed with bleomycin today. His laboratory data are currently pending but if they are close to the normal range we'll proceed with day 16 of cycle 2. This will conclude systemic chemotherapy and I will obtain CT scan in 2 months to insure that he has no evidence of disease.  2. IV access: A Port-A-Cath will be removed after his CT scan was completed in 2 months.  3. Nausea prophylaxis: He has Compazine which have worked well but caused shakiness and that have been discontinued. He has other antiemetics to use quitting Zofran and Reglan.  4. Bleomycin toxicity screen: He has not reported any  respiratory symptoms we will continue to monitor this very closely.  5. VTE prophylaxis: Continue to educate him about signs of symptoms of thrombosis and to report any symptoms as soon as possible.    [facility], MD 3/24/20158:48 AM

## 2014-01-30 NOTE — Telephone Encounter (Signed)
returned pt call and sched flush...done pt aware of d.t

## 2014-01-30 NOTE — Telephone Encounter (Signed)
[facility] ADN PRINTED APPT SCHED AND AVS FOR PT FOR MAY....GV PT BARIUM

## 2014-02-06 ENCOUNTER — Other Ambulatory Visit (HOSPITAL_BASED_OUTPATIENT_CLINIC_OR_DEPARTMENT_OTHER): Payer: Managed Care, Other (non HMO) | Admitting: Oncology

## 2014-02-06 ENCOUNTER — Telehealth: Payer: Self-pay | Admitting: *Deleted

## 2014-02-06 ENCOUNTER — Other Ambulatory Visit: Payer: Self-pay | Admitting: Oncology

## 2014-02-06 ENCOUNTER — Ambulatory Visit (HOSPITAL_COMMUNITY)
Admission: RE | Admit: 2014-02-06 | Discharge: 2014-02-06 | Disposition: A | Discharge status: Home / Self Care | Payer: Managed Care, Other (non HMO) | Source: Ambulatory Visit | Attending: Oncology | Admitting: Oncology

## 2014-02-06 DIAGNOSIS — C6290 Malignant neoplasm of unspecified testis, unspecified whether descended or undescended: Secondary | ICD-10-CM

## 2014-02-06 NOTE — Telephone Encounter (Signed)
ON 02/05/14 PT. NOTICED PAC AREA WAS SORE AND TENDER. TODAY HE NOTICED THE PUS AND A "WHITE STRING". PT. DOES NOT HAVE A FEVER. THE PAC AREA IS A REDDISH BLUE COLOR. VERBAL ORDER AND READ BACK TO DR.[place] TO BE EVALUATED. CALLED INTERVENTIONAL RADIOLOGY CONCERNING PT.'S PROBLEM. INSTRUCTED TO HAVE PT. COME TO RADIOLOGY. NOTIFIED PT. HE VOICES UNDERSTANDING.

## 2014-02-06 NOTE — Procedures (Signed)
Pt presented to IR dept today for evaluation of tenderness/erythema/drainage at venous access site of recently placed right IJ port a cath. Pt states he began to notice irritation at IJ access site few days ago and he saw a small amount of drainage yesterday. His port a cath was placed on 12/28/13 secondary to need for chemotherapy for testicular carcinoma.. He has completed his chemotherapy regimen and is awaiting f/u CT scan in 03/2014 to assess for any residual disease prior to port removal. On exam, the port site is clean and dry. The right IJ access site has a small area of erythema and is sl tender to palpation. Purulent drainage cannot be expressed from site. There are small fragments of vicryl suture visible from both port and venous access sites. Following application of chlorhexidine prep to both sites the suture fragments were removed. Polysporin ointment was applied over the venous access site. Pt denied recent fevers, dyspnea or CP. VSS . A prescription for keflex 500 mg bid po for 7 days was given to pt. He was told to apply polysporin ointment to right IJ venous access site for next 3-4 days and contact our service with any questions. Dr. Kathlene Cote also evaluated the pt.

## 2014-02-06 NOTE — Progress Notes (Signed)
Patient ID: Roberto Ruiz, male   DOB: Jan 21, 1966, 48 y.o.   MRN: [id number] Pt presented to IR dept today for evaluation of tenderness/erythema/drainage at venous access site of recently placed right IJ port a cath. Pt states he began to notice irritation at IJ access site few days ago and he saw a small amount of drainage yesterday. His port a cath was placed on 12/28/13 secondary to need for chemotherapy for testicular carcinoma.. He has completed his chemotherapy regimen and is awaiting f/u CT scan in 03/2014 to assess for any residual disease prior to port removal. On exam, the port site is clean and dry. The right IJ access site has a small area of erythema and is sl tender to palpation. Purulent drainage cannot be expressed from site. There are small fragments of vicryl suture visible from both port and venous access sites. Following application of chlorhexidine prep to both sites the suture fragments were removed. Polysporin ointment was applied over the venous access site. Pt denied recent fevers, dyspnea or CP. VSS . A prescription for keflex 500 mg bid po for 7 days was given to pt. He was told to apply polysporin ointment to right IJ venous access site for next 3-4 days and contact our service with any questions. Dr. Kathlene Cote also evaluated the pt.

## 2014-03-27 ENCOUNTER — Ambulatory Visit
Admission: RE | Admit: 2014-03-27 | Discharge: 2014-03-27 | Disposition: A | Discharge status: Home / Self Care | Payer: Managed Care, Other (non HMO) | Source: Ambulatory Visit | Attending: Oncology | Admitting: Oncology

## 2014-03-27 ENCOUNTER — Other Ambulatory Visit: Payer: Managed Care, Other (non HMO)

## 2014-03-27 ENCOUNTER — Ambulatory Visit: Payer: Managed Care, Other (non HMO)

## 2014-03-27 VITALS — BP 138/72 | HR 66 | Temp 97.4°F

## 2014-03-27 DIAGNOSIS — C6290 Malignant neoplasm of unspecified testis, unspecified whether descended or undescended: Secondary | ICD-10-CM

## 2014-03-27 DIAGNOSIS — Z95828 Presence of other vascular implants and grafts: Secondary | ICD-10-CM

## 2014-03-27 LAB — LACTATE DEHYDROGENASE (CC13): LDH: 217 U/L (ref 125–245)

## 2014-03-27 LAB — CBC WITH DIFFERENTIAL/PLATELET
BASO%: 0.7 % (ref 0.0–2.0)
BASOS ABS: 0 10*3/uL (ref 0.0–0.1)
EOS%: 2.7 % (ref 0.0–7.0)
Eosinophils Absolute: 0.1 10*3/uL (ref 0.0–0.5)
HEMATOCRIT: 38.2 % — AB (ref 38.4–49.9)
HEMOGLOBIN: 12.5 g/dL — AB (ref 13.0–17.1)
LYMPH%: 26.1 % (ref 14.0–49.0)
MCH: 28.8 pg (ref 27.2–33.4)
MCHC: 32.7 g/dL (ref 32.0–36.0)
MCV: 88 fL (ref 79.3–98.0)
MONO#: 0.3 10*3/uL (ref 0.1–0.9)
MONO%: 6.9 % (ref 0.0–14.0)
NEUT%: 63.6 % (ref 39.0–75.0)
NEUTROS ABS: 2.8 10*3/uL (ref 1.5–6.5)
Platelets: 169 10*3/uL (ref 140–400)
RBC: 4.34 10*6/uL (ref 4.20–5.82)
RDW: 13.8 % (ref 11.0–14.6)
WBC: 4.4 10*3/uL (ref 4.0–10.3)
lymph#: 1.1 10*3/uL (ref 0.9–3.3)

## 2014-03-27 LAB — COMPREHENSIVE METABOLIC PANEL (CC13)
ALT: 25 U/L (ref 0–55)
AST: 24 U/L (ref 5–34)
Albumin: 3.7 g/dL (ref 3.5–5.0)
Alkaline Phosphatase: 63 U/L (ref 40–150)
Anion Gap: 12 mEq/L — ABNORMAL HIGH (ref 3–11)
BILIRUBIN TOTAL: 0.32 mg/dL (ref 0.20–1.20)
BUN: 12.9 mg/dL (ref 7.0–26.0)
CHLORIDE: 112 meq/L — AB (ref 98–109)
CO2: 20 meq/L — AB (ref 22–29)
CREATININE: 1.2 mg/dL (ref 0.7–1.3)
Calcium: 9.3 mg/dL (ref 8.4–10.4)
Glucose: 94 mg/dl (ref 70–140)
Potassium: 4.2 mEq/L (ref 3.5–5.1)
SODIUM: 144 meq/L (ref 136–145)
Total Protein: 6.5 g/dL (ref 6.4–8.3)

## 2014-03-27 MED ORDER — HEPARIN SOD (PORK) LOCK FLUSH 100 UNIT/ML IV SOLN
500.0000 [IU] | Freq: Once | INTRAVENOUS | Status: AC
  Administered 2014-03-27: 500 [IU] via INTRAVENOUS
  Filled 2014-03-27: qty 5

## 2014-03-27 MED ORDER — IOHEXOL 300 MG/ML  SOLN
125.0000 mL | Freq: Once | INTRAMUSCULAR | Status: AC | PRN
  Administered 2014-03-27: 125 mL via INTRAVENOUS

## 2014-03-27 MED ORDER — SODIUM CHLORIDE 0.9 % IJ SOLN
10.0000 mL | INTRAMUSCULAR | Status: DC | PRN
  Administered 2014-03-27: 10 mL via INTRAVENOUS
  Filled 2014-03-27: qty 10

## 2014-03-27 NOTE — Patient Instructions (Signed)

## 2014-03-29 LAB — BETA HCG QUANT (REF LAB)

## 2014-03-29 LAB — AFP TUMOR MARKER: AFP TUMOR MARKER: 3.9 ng/mL (ref 0.0–8.0)

## 2014-04-03 ENCOUNTER — Telehealth: Payer: Self-pay | Admitting: Oncology

## 2014-04-03 ENCOUNTER — Encounter: Payer: Self-pay | Admitting: Oncology

## 2014-04-03 ENCOUNTER — Ambulatory Visit (HOSPITAL_BASED_OUTPATIENT_CLINIC_OR_DEPARTMENT_OTHER): Payer: Managed Care, Other (non HMO) | Admitting: Oncology

## 2014-04-03 VITALS — BP 128/78 | HR 70 | Temp 97.9°F | Resp 18 | Wt 233.2 lb

## 2014-04-03 DIAGNOSIS — C6290 Malignant neoplasm of unspecified testis, unspecified whether descended or undescended: Secondary | ICD-10-CM

## 2014-04-03 NOTE — Progress Notes (Signed)
Hematology and Oncology Follow Up Visit  Roberto Ruiz [id number] April 22, 1966 48 y.o. 04/03/2014 8:40 AM Roberto Ruiz, MDBouska, [name], MD   Principle Diagnosis: 48 year old gentleman with stage IB nonseminomatous testis cancer presented with a T2 N0 mass in his left testicle. The pathology revealed mixed histology with 60% seminoma, 40% embryonal. Lymphovascular invasion was identified.   Prior Therapy:  He is status post left orchiectomy done on 11/13/2013. He is S/P adjuvant chemotherapy in the form of BEP completed 01/30/2014.  Current therapy: Observation and surveillance.  Interim History: Mr. Roberto Ruiz presents today for a followup visit with his wife. He tolerated chemotherapy without any major complications. He still have tinnitus associated with chemotherapy but is improving. He developed a grade 2 nausea, grade 1 fatigue, and generalized tiredness and all have resolved at this time. Did not report any peripheral neuropathy. Did not report any shortness of breath or difficulty breathing. Does report occasional cough but no exertional dyspnea. Did not report any hemoptysis or hematemesis. He is able to perform most activities of daily living without any hindrance or decline. Did not have any hospitalizations or illnesses. He has resumed all his work-related activities. He did not report any fevers or chills or sweats. Does not report any nausea or vomiting at this time. At that point any hematochezia or melena. Does not report any arthralgias or myalgias. Rest of his review of system is unremarkable.  Medications: I have reviewed the patient's current medications. Unchanged by my review. Current Outpatient Prescriptions  Medication Sig Dispense Refill  . famotidine-calcium carbonate-magnesium hydroxide (PEPCID COMPLETE) [id number] MG CHEW chewable tablet Chew 1 tablet by mouth daily as needed.      Marland Kitchen ibuprofen (ADVIL,MOTRIN) 200 MG tablet Take 200 mg by mouth every 6 (six) hours as needed  for mild pain or moderate pain.      Marland Kitchen lidocaine-prilocaine (EMLA) cream Apply 1 application topically as needed.  30 g  0  . LORazepam (ATIVAN) 1 MG tablet Take 1 tablet (1 mg total) by mouth every 8 (eight) hours.  20 tablet  0  . ondansetron (ZOFRAN) 8 MG tablet Take 1 tablet (8 mg total) by mouth every 8 (eight) hours as needed for nausea or vomiting.  20 tablet  0   No current facility-administered medications for this visit.     Allergies:  Allergies  Allergen Reactions  . Compazine [Prochlorperazine Edisylate]     Caused sever shaking and leg cramps  . Sulfa Antibiotics Hives    Past Medical History, Surgical history, Social history, and Family History were reviewed and updated.  Physical Exam: Blood pressure 128/78, pulse 70, temperature 97.9 F (36.6 C), temperature source Oral, resp. rate 18, weight 233 lb 3 oz (105.773 kg). ECOG: 0 General appearance: alert and appears stated age Head: Normocephalic, no masses or lesions. Neck: no adenopathy and thyroid not enlarged, symmetric, no tenderness/mass/nodules Lymph nodes: Cervical, supraclavicular, and axillary nodes normal. Heart:regular rate and rhythm, S1, S2.  Lung:chest clear, no wheezing, rales. I could not appreciate any decreased breath sounds bilaterally. Abdomin: soft, non-tender, without masses or organomegaly no ascites or shifting dullness. EXT:no erythema, induration, or nodules no edema. Neurological: No deficits noted.  Lab Results: Lab Results  Component Value Date   WBC 4.4 03/27/2014   HGB 12.5* 03/27/2014   HCT 38.2* 03/27/2014   MCV 88.0 03/27/2014   PLT 169 03/27/2014     Chemistry      Component Value Date/Time   NA 144 03/27/2014 5366  NA 132* 12/26/2013 0936   K 4.2 03/27/2014 0811   K 4.1 12/26/2013 0936   CL 99 12/26/2013 0936   CO2 20* 03/27/2014 0811   CO2 22 12/26/2013 0936   BUN 12.9 03/27/2014 0811   BUN 14 12/26/2013 0936   CREATININE 1.2 03/27/2014 0811   CREATININE 1.05 12/26/2013 0936       Component Value Date/Time   CALCIUM 9.3 03/27/2014 0811   CALCIUM 8.7 12/26/2013 0936   ALKPHOS 63 03/27/2014 0811   ALKPHOS 46 12/26/2013 0936   AST 24 03/27/2014 0811   AST 23 12/26/2013 0936   ALT 25 03/27/2014 0811   ALT 36 12/26/2013 0936   BILITOT 0.32 03/27/2014 0811   BILITOT 0.5 12/26/2013 0936     EXAM:  CT CHEST, ABDOMEN, AND PELVIS WITH CONTRAST  TECHNIQUE:  Multidetector CT imaging of the chest, abdomen and pelvis was  performed following the standard protocol during bolus  administration of intravenous contrast.  CONTRAST: 131mL OMNIPAQUE IOHEXOL 300 MG/ML SOLN  COMPARISON: Prior CT from 04/10/2008.  FINDINGS:  CT CHEST FINDINGS  The visualized thyroid gland is unremarkable.  No pathologically enlarged mediastinal, hilar, or axillary lymph  nodes are identified.  The intrathoracic aorta and great vessels demonstrate a normal  caliber and appearance. Right-sided Port-A-Cath in place. Heart size  is normal. No pericardial effusion. Pulmonary arteries within normal  limits.  A small left pneumothorax is present, occupying approximately 10% of  overall volume of the left hemi thorax. Associated atelectatic  changes present within the left upper lobe and lingula. Lungs are  otherwise clear without focal consolidation, pulmonary edema, or  pleural effusion.  A 7 mm nodule is present within the right upper lobe (series 3,  image 32).  No focal osseous lesion identified within the thorax. No acute  osseous abnormality within the thorax.  CT ABDOMEN AND PELVIS FINDINGS  The liver demonstrates a normal contrast enhanced appearance. No  focal intrahepatic masses. The gallbladder is within normal limits.  No biliary ductal dilatation. The the spleen, adrenal glands, and  pancreas demonstrate a normal contrast enhanced appearance.  The kidneys are equal in size with symmetric enhancement. No  nephrolithiasis, hydronephrosis, or focal enhancing renal mass.  Stomach is  normal. No evidence of bowel obstruction. Retrocecal  appendix is of normal appearance without associated inflammatory  changes to suggest acute appendicitis. No inflammatory changes seen  about the bowels. Multiple diverticula noted about the descending  and sigmoid colon without evidence of acute diverticulitis.  Bladder and prostate are within normal limits. Probable hydrocele  noted within the right scrotum.  No free air or fluid. No enlarged intra-abdominal or pelvic lymph  nodes. No adenopathy seen along the left gonadal vein as it courses  cephalad towards the left renal vein.  No focal osseous lesions identified within the abdomen and pelvis.  No acute osseous abnormality.  IMPRESSION:  1. Small left pneumothorax.  2. No CT evidence of metastatic disease identified within the chest,  abdomen, or pelvis.  3. 7 mm right upper lobe pulmonary nodule, indeterminate. If the  patient is at high risk for bronchogenic carcinoma, follow-up chest  CT at 3-35months is recommended. If the patient is at low risk for  bronchogenic carcinoma, follow-up chest CT at 6-12 months is  recommended. This recommendation follows the consensus statement:  Guidelines for Management of Small Pulmonary Nodules Detected on CT  Scans: A Statement from the [employer] as published in  Radiology 2005; 237:395-400.  4.  Colonic diverticulosis without acute diverticulitis.  Critical Value/emergent results were called by telephone at the time  of interpretation on 03/27/2014 at 1:29 PM to Dr. Zola Button , who  verbally acknowledged these results.   Impression and Plan:  48 year old gentleman with the following issues:  1. Nonseminomatous testicular cancer presented with a T2 N0 stage IB disease. He has a 40% embryonal component was lymphovascular invasion. He is receiving adjuvant chemotherapy in the form of 2 cycles of BEP. CT scan on 03/27/2014 was discussed and showed no evidence of any cancer  recurrence. The plan is to continue with active surveillance and repeat tumor markers and imaging studies in 3 months.  2. IV access: A Port-A-Cath will be removed if his neck CT scan is clear.  3. Small pneumothorax: This was discussed with radiology. He is asymptomatic and will be observed and repeat imaging studies will be done to assure resolution.  4. Small pulmonary nodule: Most likely benign but imaging studies will be repeated.  5. followup: Will be in 3 months.     Wyatt Portela, MD 5/26/20158:40 AM

## 2014-04-03 NOTE — Telephone Encounter (Signed)
gv adn printed appts cehd adn avs for pt for june and Aug.....gv pt barium

## 2014-05-08 ENCOUNTER — Ambulatory Visit (HOSPITAL_BASED_OUTPATIENT_CLINIC_OR_DEPARTMENT_OTHER): Payer: Managed Care, Other (non HMO)

## 2014-05-08 VITALS — BP 121/73 | HR 54 | Temp 97.0°F

## 2014-05-08 DIAGNOSIS — Z95828 Presence of other vascular implants and grafts: Secondary | ICD-10-CM

## 2014-05-08 DIAGNOSIS — Z452 Encounter for adjustment and management of vascular access device: Secondary | ICD-10-CM

## 2014-05-08 DIAGNOSIS — C6290 Malignant neoplasm of unspecified testis, unspecified whether descended or undescended: Secondary | ICD-10-CM

## 2014-05-08 MED ORDER — SODIUM CHLORIDE 0.9 % IJ SOLN
10.0000 mL | INTRAMUSCULAR | Status: DC | PRN
  Administered 2014-05-08: 10 mL via INTRAVENOUS
  Filled 2014-05-08: qty 10

## 2014-05-08 MED ORDER — HEPARIN SOD (PORK) LOCK FLUSH 100 UNIT/ML IV SOLN
500.0000 [IU] | Freq: Once | INTRAVENOUS | Status: AC
  Administered 2014-05-08: 500 [IU] via INTRAVENOUS
  Filled 2014-05-08: qty 5

## 2014-05-08 NOTE — Patient Instructions (Signed)

## 2014-06-20 ENCOUNTER — Ambulatory Visit (HOSPITAL_BASED_OUTPATIENT_CLINIC_OR_DEPARTMENT_OTHER): Payer: Managed Care, Other (non HMO)

## 2014-06-20 ENCOUNTER — Ambulatory Visit
Admission: RE | Admit: 2014-06-20 | Discharge: 2014-06-20 | Disposition: A | Discharge status: Home / Self Care | Payer: Managed Care, Other (non HMO) | Source: Ambulatory Visit | Attending: Oncology | Admitting: Oncology

## 2014-06-20 ENCOUNTER — Other Ambulatory Visit (HOSPITAL_BASED_OUTPATIENT_CLINIC_OR_DEPARTMENT_OTHER): Payer: Managed Care, Other (non HMO)

## 2014-06-20 DIAGNOSIS — C6290 Malignant neoplasm of unspecified testis, unspecified whether descended or undescended: Secondary | ICD-10-CM

## 2014-06-20 DIAGNOSIS — Z95828 Presence of other vascular implants and grafts: Secondary | ICD-10-CM

## 2014-06-20 DIAGNOSIS — Z452 Encounter for adjustment and management of vascular access device: Secondary | ICD-10-CM

## 2014-06-20 LAB — CBC WITH DIFFERENTIAL/PLATELET
BASO%: 0.5 % (ref 0.0–2.0)
BASOS ABS: 0 10*3/uL (ref 0.0–0.1)
EOS%: 1.2 % (ref 0.0–7.0)
Eosinophils Absolute: 0.1 10*3/uL (ref 0.0–0.5)
HEMATOCRIT: 39.4 % (ref 38.4–49.9)
HEMOGLOBIN: 13 g/dL (ref 13.0–17.1)
LYMPH%: 21 % (ref 14.0–49.0)
MCH: 28.2 pg (ref 27.2–33.4)
MCHC: 32.9 g/dL (ref 32.0–36.0)
MCV: 85.9 fL (ref 79.3–98.0)
MONO#: 0.4 10*3/uL (ref 0.1–0.9)
MONO%: 7.7 % (ref 0.0–14.0)
NEUT#: 3.5 10*3/uL (ref 1.5–6.5)
NEUT%: 69.6 % (ref 39.0–75.0)
Platelets: 149 10*3/uL (ref 140–400)
RBC: 4.59 10*6/uL (ref 4.20–5.82)
RDW: 13.9 % (ref 11.0–14.6)
WBC: 5 10*3/uL (ref 4.0–10.3)
lymph#: 1 10*3/uL (ref 0.9–3.3)

## 2014-06-20 LAB — COMPREHENSIVE METABOLIC PANEL (CC13)
ALT: 28 U/L (ref 0–55)
AST: 27 U/L (ref 5–34)
Albumin: 3.9 g/dL (ref 3.5–5.0)
Alkaline Phosphatase: 59 U/L (ref 40–150)
Anion Gap: 9 mEq/L (ref 3–11)
BILIRUBIN TOTAL: 0.78 mg/dL (ref 0.20–1.20)
BUN: 16.5 mg/dL (ref 7.0–26.0)
CALCIUM: 9.3 mg/dL (ref 8.4–10.4)
CO2: 22 mEq/L (ref 22–29)
CREATININE: 1.2 mg/dL (ref 0.7–1.3)
Chloride: 110 mEq/L — ABNORMAL HIGH (ref 98–109)
Glucose: 97 mg/dl (ref 70–140)
Potassium: 4 mEq/L (ref 3.5–5.1)
Sodium: 142 mEq/L (ref 136–145)
Total Protein: 6.6 g/dL (ref 6.4–8.3)

## 2014-06-20 LAB — LACTATE DEHYDROGENASE (CC13): LDH: 219 U/L (ref 125–245)

## 2014-06-20 MED ORDER — IOHEXOL 300 MG/ML  SOLN
125.0000 mL | Freq: Once | INTRAMUSCULAR | Status: AC | PRN
  Administered 2014-06-20: 125 mL via INTRAVENOUS

## 2014-06-20 MED ORDER — HEPARIN SOD (PORK) LOCK FLUSH 100 UNIT/ML IV SOLN
500.0000 [IU] | Freq: Once | INTRAVENOUS | Status: AC
  Administered 2014-06-20: 500 [IU] via INTRAVENOUS
  Filled 2014-06-20: qty 5

## 2014-06-20 MED ORDER — SODIUM CHLORIDE 0.9 % IJ SOLN
10.0000 mL | INTRAMUSCULAR | Status: DC | PRN
  Administered 2014-06-20: 10 mL via INTRAVENOUS
  Filled 2014-06-20: qty 10

## 2014-06-20 NOTE — Patient Instructions (Signed)

## 2014-06-22 LAB — AFP TUMOR MARKER: AFP TUMOR MARKER: 2.3 ng/mL (ref 0.0–8.0)

## 2014-06-22 LAB — BETA HCG QUANT (REF LAB): Beta hCG, Tumor Marker: <2 m[IU]/mL (ref <5.0)

## 2014-06-27 ENCOUNTER — Ambulatory Visit (HOSPITAL_BASED_OUTPATIENT_CLINIC_OR_DEPARTMENT_OTHER): Payer: Managed Care, Other (non HMO) | Admitting: Oncology

## 2014-06-27 ENCOUNTER — Other Ambulatory Visit: Payer: Self-pay | Admitting: Radiology

## 2014-06-27 ENCOUNTER — Telehealth: Payer: Self-pay | Admitting: Oncology

## 2014-06-27 ENCOUNTER — Encounter (HOSPITAL_COMMUNITY): Payer: Self-pay | Admitting: Pharmacy Technician

## 2014-06-27 ENCOUNTER — Encounter: Payer: Self-pay | Admitting: Oncology

## 2014-06-27 VITALS — BP 126/80 | HR 67 | Temp 98.6°F | Resp 19 | Ht 69.0 in | Wt 231.2 lb

## 2014-06-27 DIAGNOSIS — Z8547 Personal history of malignant neoplasm of testis: Secondary | ICD-10-CM

## 2014-06-27 DIAGNOSIS — C6290 Malignant neoplasm of unspecified testis, unspecified whether descended or undescended: Secondary | ICD-10-CM

## 2014-06-27 NOTE — Progress Notes (Signed)
Hematology and Oncology Follow Up Visit  Roberto Ruiz [id number] [birth year] 48 y.o. 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 The pathology revealed mixed histology with 60% seminoma, 40% embryonal. Lymphovascular invasion was identified.   Prior Therapy:  He is status post left orchiectomy done on 11/13/2013. He is S/P adjuvant chemotherapy in the form of BEP completed 01/30/2014.  Current therapy: Observation and surveillance.  Interim History: Roberto Ruiz presents today for a followup visit with his wife. He completed chemotherapy without any major complications. He still have tinnitus that is improving continue with. He resolved his work-related duties without any decline. Did not report any peripheral neuropathy. Did not report any shortness of breath or difficulty breathing. Does report occasional cough but no exertional dyspnea. Did not report any hemoptysis or hematemesis. Did not have any hospitalizations or illnesses. He did not report any fevers or chills or sweats. Does not report any nausea or vomiting at this time. At that point any hematochezia or melena. Does not report any arthralgias or myalgias. He does not report any other neurological symptoms. He does not report any lymphadenopathy or petechiae. Rest of his review of system is unremarkable.  Medications: I have reviewed the patient's current medications. Unchanged by my review. Current Outpatient Prescriptions  Medication Sig Dispense Refill  . famotidine-calcium carbonate-magnesium hydroxide (PEPCID COMPLETE) [phone] MG CHEW chewable tablet Chew 1 tablet by mouth daily as needed.      Marland Kitchen ibuprofen (ADVIL,MOTRIN) 200 MG tablet Take 200 mg by mouth every 6 (six) hours as needed for mild pain or moderate pain.      Marland Kitchen lidocaine-prilocaine (EMLA) cream Apply 1 application  topically as needed.  30 g  0  . LORazepam (ATIVAN) 1 MG tablet Take 1 tablet (1 mg total) by mouth every 8 (eight) hours.  20 tablet  0  . ondansetron (ZOFRAN) 8 MG tablet Take 1 tablet (8 mg total) by mouth every 8 (eight) hours as needed for nausea or vomiting.  20 tablet  0   No current facility-administered medications for this visit.     Allergies:  Allergies  Allergen Reactions  . Compazine [Prochlorperazine Edisylate]     Caused sever shaking and leg cramps  . Sulfa Antibiotics Hives    Past Medical History, Surgical history, Social history, and Family History were reviewed and updated.  Physical Exam: Blood pressure 126/80, pulse 67, temperature 98.6 F (37 C), temperature source Oral, resp. rate 19, height 5\' 9"  (1.753 m), weight 231 lb 3.2 oz (104.872 kg). ECOG: 0 General appearance: alert and appears stated age Head: Normocephalic, no masses or lesions. Neck: no adenopathy Lymph nodes: Cervical, supraclavicular, and axillary nodes normal. Heart:regular rate and rhythm, S1, S2.  Lung:chest clear, no wheezing, rales. Good breath sounds. Abdomin: soft, non-tender, without masses or organomegaly no ascites or shifting dullness. EXT:no erythema, induration, or nodules no edema. Neurological: No deficits noted.  Lab Results: Lab Results  Component Value Date   WBC 5.0 06/20/2014   HGB 13.0 06/20/2014   HCT 39.4 06/20/2014   MCV 85.9 06/20/2014   PLT 149 06/20/2014     Chemistry      Component Value Date/Time   NA 142 06/20/2014 0808   NA 132* 12/26/2013 0936   K 4.0 06/20/2014 0808   K 4.1 12/26/2013 0936   CL 99 12/26/2013 0936   CO2  22 06/20/2014 0808   CO2 22 12/26/2013 0936   BUN 16.5 06/20/2014 0808   BUN 14 12/26/2013 0936   CREATININE 1.2 06/20/2014 0808   CREATININE 1.05 12/26/2013 0936      Component Value Date/Time   CALCIUM 9.3 06/20/2014 0808   CALCIUM 8.7 12/26/2013 0936   ALKPHOS 59 06/20/2014 0808   ALKPHOS 46 12/26/2013 0936   AST 27 06/20/2014 0808    AST 23 12/26/2013 0936   ALT 28 06/20/2014 0808   ALT 36 12/26/2013 0936   BILITOT 0.78 06/20/2014 0808   BILITOT 0.5 12/26/2013 0936      EXAM:  CT CHEST, ABDOMEN, AND PELVIS WITH CONTRAST  TECHNIQUE:  Multidetector CT imaging of the chest, abdomen and pelvis was  performed following the standard protocol during bolus  administration of intravenous contrast.  CONTRAST: 185mL OMNIPAQUE IOHEXOL 300 MG/ML SOLN  COMPARISON: 03/27/2014.  FINDINGS:  CT CHEST FINDINGS  Chest wall:  A right-sided Port-A-Cath is noted. No supraclavicular or axillary  lymphadenopathy. The thyroid gland is normal. The bony thorax is  intact. No destructive bone lesions or spinal canal compromise.  Mediastinum:  The heart is normal in size. No pericardial effusion. No mediastinal  or hilar mass or adenopathy. The aorta is normal and stable. The  esophagus is unremarkable.  Lungs:  No acute pulmonary findings. No worrisome pulmonary nodules to  suggest metastatic disease. No pleural effusion. No bronchiectasis  or interstitial lung disease.  CT ABDOMEN AND PELVIS FINDINGS  CT abdomen:  Mild diffuse fatty infiltration of the liver but no focal hepatic  lesions or intrahepatic biliary dilatation. The portal and hepatic  veins are patent. The gallbladder is normal. No common bowel duct  dilatation. The pancreas is normal. The spleen is normal. The  adrenal glands and kidneys are normal.  The stomach, duodenum, small bowel and colon are unremarkable. There  is moderate diverticulosis involving the upper sigmoid colon but no  findings for acute diverticulitis. The appendix is normal. No  mesenteric or retroperitoneal mass or adenopathy. The aorta and  branch vessels are patent. The major venous structures are patent.  CT pelvis:  The bladder, prostate gland and seminal vesicles are unremarkable.  No pelvic mass, adenopathy or free pelvic fluid collections. No  inguinal mass or adenopathy.  Bony structures:   No acute bony findings. No worrisome bone lesions.  IMPRESSION:  Stable CT appearance of the chest, abdomen and pelvis. No findings  for metastatic disease.   Impression and Plan:  48 year old gentleman with the following issues:  1. Nonseminomatous testicular cancer presented with a T2 N0 stage IB disease. He has a 40% embryonal component was lymphovascular invasion. He is S/P adjuvant chemotherapy in the form of 2 cycles of BEP. CT scan on 06/20/2014 was discussed and showed no evidence of any cancer recurrence. The plan is to continue with active surveillance and repeat tumor markers and  3 months. I plan to repeat his CT scan chest abdomen and pelvis in 6 months. He will need annual abdominal CT imaging and chest x-ray about every 6 months.  2. IV access: A Port-A-Cath will be removed and in near future.  3. Small pneumothorax: This resolved following repeat imaging studies.  4. Small pulmonary nodule: Most likely benign but imaging studies will be repeated.  5. followup: Will be in 3 months.     [facility], MD 8/19/20158:29 AM

## 2014-06-27 NOTE — Telephone Encounter (Signed)
gv and printed appt sched and avs for pt NOV and Aug

## 2014-06-28 ENCOUNTER — Ambulatory Visit (HOSPITAL_COMMUNITY)
Admission: RE | Admit: 2014-06-28 | Discharge: 2014-06-28 | Disposition: A | Discharge status: Home / Self Care | Payer: Managed Care, Other (non HMO) | Source: Ambulatory Visit | Attending: Oncology | Admitting: Oncology

## 2014-06-28 ENCOUNTER — Encounter (HOSPITAL_COMMUNITY): Payer: Self-pay

## 2014-06-28 DIAGNOSIS — K219 Gastro-esophageal reflux disease without esophagitis: Secondary | ICD-10-CM | POA: Insufficient documentation

## 2014-06-28 DIAGNOSIS — C6290 Malignant neoplasm of unspecified testis, unspecified whether descended or undescended: Secondary | ICD-10-CM

## 2014-06-28 DIAGNOSIS — Z452 Encounter for adjustment and management of vascular access device: Secondary | ICD-10-CM | POA: Insufficient documentation

## 2014-06-28 DIAGNOSIS — Z8547 Personal history of malignant neoplasm of testis: Secondary | ICD-10-CM | POA: Insufficient documentation

## 2014-06-28 LAB — BASIC METABOLIC PANEL
Anion gap: 14 (ref 5–15)
BUN: 19 mg/dL (ref 6–23)
CALCIUM: 9.4 mg/dL (ref 8.4–10.5)
CHLORIDE: 103 meq/L (ref 96–112)
CO2: 20 mEq/L (ref 19–32)
CREATININE: 1.19 mg/dL (ref 0.50–1.35)
GFR calc non Af Amer: 72 mL/min — ABNORMAL LOW (ref >90)
GFR, EST AFRICAN AMERICAN: 84 mL/min — AB (ref >90)
Glucose, Bld: 91 mg/dL (ref 70–99)
Potassium: 5.5 mEq/L — ABNORMAL HIGH (ref 3.7–5.3)
Sodium: 137 mEq/L (ref 137–147)

## 2014-06-28 LAB — CBC
HCT: 40.5 % (ref 39.0–52.0)
Hemoglobin: 13.9 g/dL (ref 13.0–17.0)
MCH: 29 pg (ref 26.0–34.0)
MCHC: 34.3 g/dL (ref 30.0–36.0)
MCV: 84.4 fL (ref 78.0–100.0)
Platelets: 170 10*3/uL (ref 150–400)
RBC: 4.8 MIL/uL (ref 4.22–5.81)
RDW: 13.1 % (ref 11.5–15.5)
WBC: 5.6 10*3/uL (ref 4.0–10.5)

## 2014-06-28 LAB — PROTIME-INR
INR: 0.99 (ref 0.00–1.49)
PROTHROMBIN TIME: 13.1 s (ref 11.6–15.2)

## 2014-06-28 LAB — APTT: aPTT: 31 seconds (ref 24–37)

## 2014-06-28 MED ORDER — MIDAZOLAM HCL 2 MG/2ML IJ SOLN
INTRAMUSCULAR | Status: AC | PRN
  Administered 2014-06-28: 2 mg via INTRAVENOUS

## 2014-06-28 MED ORDER — FENTANYL CITRATE 0.05 MG/ML IJ SOLN
INTRAMUSCULAR | Status: AC
  Filled 2014-06-28: qty 2

## 2014-06-28 MED ORDER — CEFAZOLIN SODIUM-DEXTROSE 2-3 GM-% IV SOLR
INTRAVENOUS | Status: AC
  Filled 2014-06-28: qty 50

## 2014-06-28 MED ORDER — LIDOCAINE-EPINEPHRINE (PF) 2 %-1:200000 IJ SOLN
INTRAMUSCULAR | Status: AC
  Filled 2014-06-28: qty 20

## 2014-06-28 MED ORDER — SODIUM CHLORIDE 0.9 % IV SOLN
Freq: Once | INTRAVENOUS | Status: AC
  Administered 2014-06-28: 500 mL via INTRAVENOUS

## 2014-06-28 MED ORDER — MIDAZOLAM HCL 2 MG/2ML IJ SOLN
INTRAMUSCULAR | Status: AC
  Filled 2014-06-28: qty 2

## 2014-06-28 MED ORDER — CEFAZOLIN SODIUM-DEXTROSE 2-3 GM-% IV SOLR
2.0000 g | Freq: Once | INTRAVENOUS | Status: AC
  Administered 2014-06-28: 2 g via INTRAVENOUS

## 2014-06-28 MED ORDER — FENTANYL CITRATE 0.05 MG/ML IJ SOLN
INTRAMUSCULAR | Status: AC | PRN
  Administered 2014-06-28: 100 ug via INTRAVENOUS

## 2014-06-28 NOTE — Discharge Instructions (Signed)
Conscious Sedation Sedation is the use of medicines to promote relaxation and relieve discomfort and anxiety. Conscious sedation is a type of sedation. Under conscious sedation you are less alert than normal but are still able to respond to instructions or stimulation. Conscious sedation is used during short medical and dental procedures. It is milder than deep sedation or general anesthesia and allows you to return to your regular activities sooner.  LET [facility] CARE PROVIDER KNOW ABOUT:   Any allergies you have.  All medicines you are taking, including vitamins, herbs, eye drops, creams, and over-the-counter medicines.  Use of steroids (by mouth or creams).  Previous problems you or members of your family have had with the use of anesthetics.  Any blood disorders you have.  Previous surgeries you have had.  Medical conditions you have.  Possibility of pregnancy, if this applies.  Use of cigarettes, alcohol, or illegal drugs. RISKS AND COMPLICATIONS Generally, this is a safe procedure. However, as with any procedure, problems can occur. Possible problems include:  Oversedation.  Trouble breathing on your own. You may need to have a breathing tube until you are awake and breathing on your own.  Allergic reaction to any of the medicines used for the procedure. BEFORE THE PROCEDURE  You may have blood tests done. These tests can help show how well your kidneys and liver are working. They can also show how well your blood clots.  A physical exam will be done.  Only take medicines as directed by your health care provider. You may need to stop taking medicines (such as blood thinners, aspirin, or nonsteroidal anti-inflammatory drugs) before the procedure.   Do not eat or drink at least 6 hours before the procedure or as directed by your health care provider.  Arrange for a responsible adult, family member, or friend to take you home after the procedure. He or she should stay  with you for at least 24 hours after the procedure, until the medicine has worn off. PROCEDURE   An intravenous (IV) catheter will be inserted into one of your veins. Medicine will be able to flow directly into your body through this catheter. You may be given medicine through this tube to help prevent pain and help you relax.  The medical or dental procedure will be done. AFTER THE PROCEDURE  You will stay in a recovery area until the medicine has worn off. Your blood pressure and pulse will be checked.   Depending on the procedure you had, you may be allowed to go home when you can tolerate liquids and your pain is under control. Document Released: 07/21/2001 Document Revised: 10/31/2013 Document Reviewed: 07/03/2013 [facility] Patient Information 2015 [place], [place]. This information is not intended to replace advice given to you by your health care provider. Make sure you discuss any questions you have with your health care provider. Conscious Sedation, Adult, Care After Refer to this sheet in the next few weeks. These instructions provide you with information on caring for yourself after your procedure. Your health care provider may also give you more specific instructions. Your treatment has been planned according to current medical practices, but problems sometimes occur. Call your health care provider if you have any problems or questions after your procedure. WHAT TO EXPECT AFTER THE PROCEDURE  After your procedure:  You may feel sleepy, clumsy, and have poor balance for several hours.  Vomiting may occur if you eat too soon after the procedure. HOME CARE INSTRUCTIONS  Do not participate  in any activities where you could become injured for at least 24 hours. Do not:  Drive.  Swim.  Ride a bicycle.  Operate heavy machinery.  Cook.  Use power tools.  Climb ladders.  Work from a high place.  Do not make important decisions or sign legal documents until you are  improved.  If you vomit, drink water, juice, or soup when you can drink without vomiting. Make sure you have little or no nausea before eating solid foods.  Only take over-the-counter or prescription medicines for pain, discomfort, or fever as directed by your health care provider.  Make sure you and your family fully understand everything about the medicines given to you, including what side effects may occur.  You should not drink alcohol, take sleeping pills, or take medicines that cause drowsiness for at least 24 hours.  If you smoke, do not smoke without supervision.  If you are feeling better, you may resume normal activities 24 hours after you were sedated.  Keep all appointments with your health care provider. SEEK MEDICAL CARE IF:  Your skin is pale or bluish in color.  You continue to feel nauseous or vomit.  Your pain is getting worse and is not helped by medicine.  You have bleeding or swelling.  You are still sleepy or feeling clumsy after 24 hours. SEEK IMMEDIATE MEDICAL CARE IF:  You develop a rash.  You have difficulty breathing.  You develop any type of allergic problem.  You have a fever. MAKE SURE YOU:  Understand these instructions.  Will watch your condition.  Will get help right away if you are not doing well or get worse. Document Released: 08/16/2013 Document Reviewed: 08/16/2013 [facility] Patient Information 2015 [place], [place]. This information is not intended to replace advice given to you by your health care provider. Make sure you discuss any questions you have with your health care provider.

## 2014-06-28 NOTE — H&P (Signed)
Chief Complaint: "I am here to get my port removed." Referring Physician: Dr. Alen Blew HPI: Roberto Ruiz is an 48 y.o. male with testicular cancer s/p treatment. He is here today for a right sided port a catheter removal. His port was placed 12/2013, he denies any difficulty with his port. He denies any chest pain, shortness of breath or palpitations. He denies any active signs of bleeding or excessive bruising. He denies any recent fever or chills. The patient denies any history of sleep apnea or chronic oxygen use. He has previously tolerated sedation without complications.   Past Medical History:  Past Medical History  Diagnosis Date  . Wears contact lenses   . Cancer     testicular  . GERD (gastroesophageal reflux disease)     with Chemo    Past Surgical History:  Past Surgical History  Procedure Laterality Date  . Tonsillectomy and adenoidectomy  1990  . Right arthroscopy w/ open removal of reputured bursa  1987  . Orchiectomy Left 11/13/2013    Procedure: LEFT RADICAL INGUINAL ORCHIECTOMY;  Surgeon: Molli Hazard, MD;  Location: [facility];  Service: Urology;  Laterality: Left;    Family History: History reviewed. No pertinent family history.  Social History:  reports that he has never smoked. He has never used smokeless tobacco. He reports that he does not drink alcohol or use illicit drugs.  Allergies:  Allergies  Allergen Reactions  . Compazine [Prochlorperazine Edisylate]     Caused sever shaking and leg cramps  . Sulfa Antibiotics Hives    Medications:   Medication List    Notice   You have not been prescribed any medications.     Please HPI for pertinent positives, otherwise complete 10 system ROS negative.  Physical Exam: BP 131/89  Pulse 73  Temp(Src) 98.3 F (36.8 C) (Oral)  Resp 18  Ht 5' 8" (1.727 m)  Wt 231 lb (104.781 kg)  BMI 35.13 kg/m2  SpO2 99% Body mass index is 35.13 kg/(m^2).  General Appearance:  Alert,  cooperative, no distress  Head:  Normocephalic, without obvious abnormality, atraumatic  Neck: Supple, symmetrical, trachea midline  Lungs:   Clear to auscultation bilaterally, no w/r/r, respirations unlabored without use of accessory muscles.  Chest Wall:  No tenderness, right port intact without signs of infection.  Heart:  Regular rate and rhythm, S1, S2 normal, no murmur, rub or gallop.  Abdomen:   Soft, non-tender, non distended, (+) BS  Extremities: Extremities normal, atraumatic, no cyanosis or edema  Neurologic: Normal affect, no gross deficits.   Results for orders placed during the hospital encounter of 06/28/14 (from the past 48 hour(s))  APTT     Status: None   Collection Time    06/28/14 12:35 PM      Result Value Ref Range   aPTT 31  24 - 37 seconds  BASIC METABOLIC PANEL     Status: Abnormal   Collection Time    06/28/14 12:35 PM      Result Value Ref Range   Sodium 137  137 - 147 mEq/L   Potassium 5.5 (*) 3.7 - 5.3 mEq/L   Comment: SLIGHT HEMOLYSIS     HEMOLYSIS AT THIS LEVEL MAY AFFECT RESULT   Chloride 103  96 - 112 mEq/L   CO2 20  19 - 32 mEq/L   Glucose, Bld 91  70 - 99 mg/dL   BUN 19  6 - 23 mg/dL   Creatinine, Ser 1.19  0.50 -  1.35 mg/dL   Calcium 9.4  8.4 - 10.5 mg/dL   GFR calc non Af Amer 72 (*) >90 mL/min   GFR calc Af Amer 84 (*) >90 mL/min   Comment: (NOTE)     The eGFR has been calculated using the CKD EPI equation.     This calculation has not been validated in all clinical situations.     eGFR's persistently <90 mL/min signify possible Chronic Kidney     Disease.   Anion gap 14  5 - 15  CBC     Status: None   Collection Time    06/28/14 12:35 PM      Result Value Ref Range   WBC 5.6  4.0 - 10.5 K/uL   RBC 4.80  4.22 - 5.81 MIL/uL   Hemoglobin 13.9  13.0 - 17.0 g/dL   HCT 40.5  39.0 - 52.0 %   MCV 84.4  78.0 - 100.0 fL   MCH 29.0  26.0 - 34.0 pg   MCHC 34.3  30.0 - 36.0 g/dL   RDW 13.1  11.5 - 15.5 %   Platelets 170  150 - 400 K/uL   PROTIME-INR     Status: None   Collection Time    06/28/14 12:35 PM      Result Value Ref Range   Prothrombin Time 13.1  11.6 - 15.2 seconds   INR 0.99  0.00 - 1.49   No results found.  Assessment/Plan Testicular cancer Scheduled for port a catheter removal placed 12/2013, no longer needed.  Patient has been NPO, no blood thinners taken, afebrile, labs reviewed. Risks and Benefits discussed with the patient. All of the patient's questions were answered, patient is agreeable to proceed. Consent signed and in chart.   Tsosie Billing D PA-C 06/28/2014, 2:06 PM

## 2014-06-28 NOTE — Procedures (Signed)
Successful removal of right anterior chest wall port-a-cath. No immediate post procedural complications.  

## 2014-09-19 ENCOUNTER — Telehealth: Payer: Self-pay | Admitting: Oncology

## 2014-09-19 ENCOUNTER — Ambulatory Visit (HOSPITAL_BASED_OUTPATIENT_CLINIC_OR_DEPARTMENT_OTHER): Payer: Managed Care, Other (non HMO) | Admitting: Oncology

## 2014-09-19 ENCOUNTER — Other Ambulatory Visit (HOSPITAL_BASED_OUTPATIENT_CLINIC_OR_DEPARTMENT_OTHER): Payer: Managed Care, Other (non HMO)

## 2014-09-19 VITALS — BP 124/70 | HR 59 | Temp 98.3°F | Resp 18 | Ht 68.0 in | Wt 233.0 lb

## 2014-09-19 DIAGNOSIS — C6290 Malignant neoplasm of unspecified testis, unspecified whether descended or undescended: Secondary | ICD-10-CM

## 2014-09-19 DIAGNOSIS — R911 Solitary pulmonary nodule: Secondary | ICD-10-CM

## 2014-09-19 DIAGNOSIS — C6292 Malignant neoplasm of left testis, unspecified whether descended or undescended: Secondary | ICD-10-CM

## 2014-09-19 LAB — CBC WITH DIFFERENTIAL/PLATELET
BASO%: 0.7 % (ref 0.0–2.0)
Basophils Absolute: 0 10*3/uL (ref 0.0–0.1)
EOS%: 1.7 % (ref 0.0–7.0)
Eosinophils Absolute: 0.1 10*3/uL (ref 0.0–0.5)
HCT: 41.4 % (ref 38.4–49.9)
HGB: 13.9 g/dL (ref 13.0–17.1)
LYMPH%: 20.4 % (ref 14.0–49.0)
MCH: 29.1 pg (ref 27.2–33.4)
MCHC: 33.6 g/dL (ref 32.0–36.0)
MCV: 86.8 fL (ref 79.3–98.0)
MONO#: 0.5 10*3/uL (ref 0.1–0.9)
MONO%: 8.9 % (ref 0.0–14.0)
NEUT#: 4.1 10*3/uL (ref 1.5–6.5)
NEUT%: 68.3 % (ref 39.0–75.0)
Platelets: 163 10*3/uL (ref 140–400)
RBC: 4.77 10*6/uL (ref 4.20–5.82)
RDW: 13.2 % (ref 11.0–14.6)
WBC: 6 10*3/uL (ref 4.0–10.3)
lymph#: 1.2 10*3/uL (ref 0.9–3.3)

## 2014-09-19 LAB — COMPREHENSIVE METABOLIC PANEL (CC13)
ALK PHOS: 66 U/L (ref 40–150)
ALT: 18 U/L (ref 0–55)
AST: 17 U/L (ref 5–34)
Albumin: 4.1 g/dL (ref 3.5–5.0)
Anion Gap: 7 mEq/L (ref 3–11)
BUN: 15.5 mg/dL (ref 7.0–26.0)
CALCIUM: 9.5 mg/dL (ref 8.4–10.4)
CHLORIDE: 112 meq/L — AB (ref 98–109)
CO2: 22 mEq/L (ref 22–29)
CREATININE: 1.3 mg/dL (ref 0.7–1.3)
Glucose: 91 mg/dl (ref 70–140)
POTASSIUM: 4.5 meq/L (ref 3.5–5.1)
Sodium: 141 mEq/L (ref 136–145)
Total Bilirubin: 0.6 mg/dL (ref 0.20–1.20)
Total Protein: 6.9 g/dL (ref 6.4–8.3)

## 2014-09-19 LAB — LACTATE DEHYDROGENASE (CC13): LDH: 202 U/L (ref 125–245)

## 2014-09-19 NOTE — Telephone Encounter (Signed)
gv adn printed appt sched and avs for pt for FEb 2016 °

## 2014-09-19 NOTE — Progress Notes (Signed)
Hematology and Oncology Follow Up Visit  LEELYNN WHETSEL [id number] 09-16-1966 48 y.o. 09/19/2014 8:55 AM [name], MDBouska, [name], MD   Principle Diagnosis: 48 year old gentleman with stage IB nonseminomatous testis cancer presented with a T2 N0 mass in his left testicle. The pathology revealed mixed histology with 60% seminoma, 40% embryonal. Lymphovascular invasion was identified.   Prior Therapy:  He is status post left orchiectomy done on 11/13/2013. He is S/P adjuvant chemotherapy in the form of 2 cycles of BEP completed 01/30/2014.  Current therapy: Observation and surveillance.  Interim History: Mr. Keesling presents today for a followup visit. Since the last visit, he reports no new complaints.He still have tinnitus which has not changed dramatically. He is not reporting any vertigo or headaches. He try to avoid loud noises which triggers it. Otherwise he is very functional and has no other complaints. He has resumed all work-related duties without any decline. Did not report any peripheral neuropathy. Did not report any shortness of breath or difficulty breathing. Does report occasional cough but no exertional dyspnea. Did not report any hemoptysis or hematemesis. Did not have any hospitalizations or illnesses. He did not report any fevers or chills or sweats. Does not report any nausea or vomiting at this time. At that point any hematochezia or melena. Does not report any arthralgias or myalgias. He does not report any other neurological symptoms. He does not report any lymphadenopathy or petechiae. Rest of his review of system is unremarkable.  Medications: I have reviewed the patient's current medications. Unchanged by my review. No current outpatient prescriptions on file.   No current facility-administered medications for this visit.     Allergies:  Allergies  Allergen Reactions  . Compazine [Prochlorperazine Edisylate]     Caused sever shaking and leg cramps  . Sulfa  Antibiotics Hives    Past Medical History, Surgical history, Social history, and Family History were reviewed and updated.  Physical Exam: Blood pressure 124/70, pulse 59, temperature 98.3 F (36.8 C), temperature source Oral, resp. rate 18, height 5\' 8"  (1.727 m), weight 233 lb (105.688 kg). ECOG: 0 General appearance: alert and appears stated age Head: Normocephalic, no masses or lesions. Neck: no adenopathy Lymph nodes: Cervical, supraclavicular, and axillary nodes normal. Heart:regular rate and rhythm, S1, S2.  Lung:chest clear, no wheezing, rales. Good breath sounds. Abdomin: soft, non-tender, without masses or organomegaly no ascites or shifting dullness. EXT:no erythema, induration, or nodules no edema. Neurological: No deficits noted.  Lab Results: Lab Results  Component Value Date   WBC 6.0 09/19/2014   HGB 13.9 09/19/2014   HCT 41.4 09/19/2014   MCV 86.8 09/19/2014   PLT 163 09/19/2014     Chemistry      Component Value Date/Time   NA 137 06/28/2014 1235   NA 142 06/20/2014 0808   K 5.5* 06/28/2014 1235   K 4.0 06/20/2014 0808   CL 103 06/28/2014 1235   CO2 20 06/28/2014 1235   CO2 22 06/20/2014 0808   BUN 19 06/28/2014 1235   BUN 16.5 06/20/2014 0808   CREATININE 1.19 06/28/2014 1235   CREATININE 1.2 06/20/2014 0808      Component Value Date/Time   CALCIUM 9.4 06/28/2014 1235   CALCIUM 9.3 06/20/2014 0808   ALKPHOS 59 06/20/2014 0808   ALKPHOS 46 12/26/2013 0936   AST 27 06/20/2014 0808   AST 23 12/26/2013 0936   ALT 28 06/20/2014 0808   ALT 36 12/26/2013 0936   BILITOT 0.78 06/20/2014 8299  BILITOT 0.5 12/26/2013 0936        Impression and Plan:  48 year old gentleman with the following issues:  1. Nonseminomatous testicular cancer presented with a T2 N0 stage IB disease. He has a 40% embryonal component was lymphovascular invasion. He is S/P adjuvant chemotherapy in the form of 2 cycles of BEP. CT scan on 06/20/2014  showed no evidence of  any cancer recurrence. The plan is to continue with active surveillance and repeat tumor markers and  3 months. He will also have a chest x-ray in 3 months. I will repeat abdominal imaging on an annual basis he will be due in August 2016.  2. IV access:  Port-A-Cath was removed without any complications.  3. Small pneumothorax: This resolved following repeat imaging studies.  4. Small pulmonary nodule: Most likely benign but imaging studies will be repeated.  5. followup: Will be in 3 months.     XIDHWY,SHUOH, MD 11/11/20158:55 AM

## 2014-09-20 LAB — AFP TUMOR MARKER: AFP-Tumor Marker: 3.2 ng/mL (ref <6.1)

## 2014-09-20 LAB — BETA HCG QUANT (REF LAB): Beta hCG, Tumor Marker: <2 m[IU]/mL (ref <5.0)

## 2014-09-20 LAB — AFP TUMOR MARKER-PREVIOUS METHOD: AFP Tumor Marker: 3 ng/mL (ref 0.0–8.0)

## 2014-10-09 DIAGNOSIS — Z6831 Body mass index (BMI) 31.0-31.9, adult: Secondary | ICD-10-CM | POA: Insufficient documentation

## 2014-10-16 ENCOUNTER — Telehealth: Payer: Self-pay | Admitting: Medical Oncology

## 2014-10-16 NOTE — Telephone Encounter (Signed)
LVMOM. Per MD, patient needs to contact his Urologist to be assessed. Pt knows to call office back with any further questions.

## 2014-10-16 NOTE — Telephone Encounter (Signed)
Patient called concerned and reporting "soreness" to right testicle. Reports pain now for 1 week, sore mostly in morning or when he sits too long. States the pain returns daily and rates it 4-5/10. Denies swelling or radiating pain, and no other symptoms noted. Patient reports concern d/t "this is how the cancer was found before."  Informed pt will forwarded mssg to md and return call to him. Patient expresses thanks, knows to expect call.  LOV 11/11 F/U with MD 12/20/2014

## 2014-10-16 NOTE — Telephone Encounter (Signed)
He needs to contact his Urologist regarding that.

## 2014-12-14 ENCOUNTER — Other Ambulatory Visit (HOSPITAL_BASED_OUTPATIENT_CLINIC_OR_DEPARTMENT_OTHER): Payer: Managed Care, Other (non HMO)

## 2014-12-14 ENCOUNTER — Ambulatory Visit (HOSPITAL_COMMUNITY)
Admission: RE | Admit: 2014-12-14 | Discharge: 2014-12-14 | Disposition: A | Discharge status: Home / Self Care | Payer: Managed Care, Other (non HMO) | Source: Ambulatory Visit | Attending: Oncology | Admitting: Oncology

## 2014-12-14 DIAGNOSIS — Z8547 Personal history of malignant neoplasm of testis: Secondary | ICD-10-CM

## 2014-12-14 DIAGNOSIS — C6290 Malignant neoplasm of unspecified testis, unspecified whether descended or undescended: Secondary | ICD-10-CM

## 2014-12-14 LAB — CBC WITH DIFFERENTIAL/PLATELET
BASO%: 0.5 % (ref 0.0–2.0)
Basophils Absolute: 0 10*3/uL (ref 0.0–0.1)
EOS%: 1.4 % (ref 0.0–7.0)
Eosinophils Absolute: 0.1 10*3/uL (ref 0.0–0.5)
HCT: 40.3 % (ref 38.4–49.9)
HEMOGLOBIN: 13.6 g/dL (ref 13.0–17.1)
LYMPH#: 1.2 10*3/uL (ref 0.9–3.3)
LYMPH%: 28.3 % (ref 14.0–49.0)
MCH: 29.9 pg (ref 27.2–33.4)
MCHC: 33.7 g/dL (ref 32.0–36.0)
MCV: 88.6 fL (ref 79.3–98.0)
MONO#: 0.3 10*3/uL (ref 0.1–0.9)
MONO%: 7.2 % (ref 0.0–14.0)
NEUT%: 62.6 % (ref 39.0–75.0)
NEUTROS ABS: 2.7 10*3/uL (ref 1.5–6.5)
PLATELETS: 165 10*3/uL (ref 140–400)
RBC: 4.55 10*6/uL (ref 4.20–5.82)
RDW: 13.6 % (ref 11.0–14.6)
WBC: 4.3 10*3/uL (ref 4.0–10.3)

## 2014-12-14 LAB — COMPREHENSIVE METABOLIC PANEL (CC13)
ALK PHOS: 68 U/L (ref 40–150)
ALT: 30 U/L (ref 0–55)
ANION GAP: 10 meq/L (ref 3–11)
AST: 26 U/L (ref 5–34)
Albumin: 4.1 g/dL (ref 3.5–5.0)
BILIRUBIN TOTAL: 0.58 mg/dL (ref 0.20–1.20)
BUN: 15.5 mg/dL (ref 7.0–26.0)
CALCIUM: 9.5 mg/dL (ref 8.4–10.4)
CO2: 24 mEq/L (ref 22–29)
Chloride: 110 mEq/L — ABNORMAL HIGH (ref 98–109)
Creatinine: 1.3 mg/dL (ref 0.7–1.3)
EGFR: 64 mL/min/{1.73_m2} — ABNORMAL LOW (ref >90)
Glucose: 93 mg/dl (ref 70–140)
Potassium: 4.5 mEq/L (ref 3.5–5.1)
Sodium: 144 mEq/L (ref 136–145)
TOTAL PROTEIN: 6.9 g/dL (ref 6.4–8.3)

## 2014-12-14 LAB — LACTATE DEHYDROGENASE (CC13): LDH: 191 U/L (ref 125–245)

## 2014-12-17 LAB — BETA HCG QUANT (REF LAB): Beta hCG, Tumor Marker: <2 m[IU]/mL (ref <5.0)

## 2014-12-17 LAB — AFP TUMOR MARKER: AFP-Tumor Marker: 3.8 ng/mL (ref <6.1)

## 2014-12-17 LAB — AFP TUMOR MARKER-PREVIOUS METHOD: AFP TUMOR MARKER: 3.6 ng/mL (ref 0.0–8.0)

## 2014-12-20 ENCOUNTER — Telehealth: Payer: Self-pay | Admitting: Oncology

## 2014-12-20 ENCOUNTER — Ambulatory Visit (HOSPITAL_BASED_OUTPATIENT_CLINIC_OR_DEPARTMENT_OTHER): Payer: Managed Care, Other (non HMO) | Admitting: Oncology

## 2014-12-20 ENCOUNTER — Other Ambulatory Visit: Payer: Managed Care, Other (non HMO)

## 2014-12-20 VITALS — BP 119/63 | HR 53 | Temp 98.0°F | Resp 20 | Ht 68.0 in | Wt 230.1 lb

## 2014-12-20 DIAGNOSIS — R911 Solitary pulmonary nodule: Secondary | ICD-10-CM

## 2014-12-20 DIAGNOSIS — C6290 Malignant neoplasm of unspecified testis, unspecified whether descended or undescended: Secondary | ICD-10-CM

## 2014-12-20 DIAGNOSIS — Z8547 Personal history of malignant neoplasm of testis: Secondary | ICD-10-CM

## 2014-12-20 NOTE — Telephone Encounter (Signed)
gv and printed appt sched and avs for pt for May °

## 2014-12-20 NOTE — Progress Notes (Signed)
Hematology and Oncology Follow Up Visit  Roberto Ruiz [id number] 1966/01/21 49 y.o. 12/20/2014 8:33 AM Roberto Ruiz, MDBouska, Roberto Diamond, MD   Principle Diagnosis: 49 year old gentleman with stage IB nonseminomatous testis cancer presented with a T2 N0 mass in his left testicle. The pathology revealed mixed histology with 60% seminoma, 40% embryonal. Lymphovascular invasion was identified.   Prior Therapy:  He is status post left orchiectomy done on 11/13/2013. He is S/P adjuvant chemotherapy in the form of 2 cycles of BEP completed 01/30/2014.  Current therapy: Observation and surveillance.  Interim History: Roberto Ruiz presents today for a followup visit. Since the last visit, he reports developing right testicular pain which have resolved at this time. He was evaluated by urology and did not feel there was any abnormalities at that time. He does not report any residual complications related to chemotherapy. He is not reporting any vertigo or headaches. He has resumed all work-related duties without any decline. Did not report any peripheral neuropathy. Did not report any shortness of breath or difficulty breathing. Does report occasional cough but no exertional dyspnea. Did not report any hemoptysis or hematemesis. Did not have any hospitalizations or illnesses. He did not report any fevers or chills or sweats. Does not report any nausea or vomiting at this time. At that point any hematochezia or melena. Does not report any arthralgias or myalgias. He does not report any other neurological symptoms. He does not report any lymphadenopathy or petechiae. Rest of his review of system is unremarkable.  Medications: I have reviewed the patient's current medications. Unchanged by my review. No current outpatient prescriptions on file.   No current facility-administered medications for this visit.     Allergies:  Allergies  Allergen Reactions  . Compazine [Prochlorperazine Edisylate]     Caused  sever shaking and leg cramps  . Sulfa Antibiotics Hives    Past Medical History, Surgical history, Social history, and Family History were reviewed and updated.  Physical Exam: Blood pressure 119/63, pulse 53, temperature 98 F (36.7 C), temperature source Oral, resp. rate 20, height 5\' 8"  (1.727 m), weight 230 lb 1.6 oz (104.373 kg). ECOG: 0 General appearance: alert and appears stated age Head: Normocephalic, no masses or lesions. Neck: no adenopathy Lymph nodes: Cervical, supraclavicular, and axillary nodes normal. Heart:regular rate and rhythm, S1, S2.  Lung:chest clear, no wheezing, rales. Good breath sounds. No dullness to percussion. Abdomin: soft, non-tender, without masses or organomegaly  EXT:no erythema, induration, or nodules no edema. Neurological: No deficits noted.  Lab Results: Lab Results  Component Value Date   WBC 4.3 12/14/2014   HGB 13.6 12/14/2014   HCT 40.3 12/14/2014   MCV 88.6 12/14/2014   PLT 165 12/14/2014     Chemistry      Component Value Date/Time   NA 144 12/14/2014 0808   NA 137 06/28/2014 1235   K 4.5 12/14/2014 0808   K 5.5* 06/28/2014 1235   CL 103 06/28/2014 1235   CO2 24 12/14/2014 0808   CO2 20 06/28/2014 1235   BUN 15.5 12/14/2014 0808   BUN 19 06/28/2014 1235   CREATININE 1.3 12/14/2014 0808   CREATININE 1.19 06/28/2014 1235      Component Value Date/Time   CALCIUM 9.5 12/14/2014 0808   CALCIUM 9.4 06/28/2014 1235   ALKPHOS 68 12/14/2014 0808   ALKPHOS 46 12/26/2013 0936   AST 26 12/14/2014 0808   AST 23 12/26/2013 0936   ALT 30 12/14/2014 0808   ALT 36 12/26/2013 0936  BILITOT 0.58 12/14/2014 0808   BILITOT 0.5 12/26/2013 0936      Results for Roberto Ruiz (MRN [id number]) as of 12/20/2014 08:16  Ref. Range 12/14/2014 08:08  AFP Tumor Marker Latest Range: <6.1 ng/mL 3.8  Beta hCG, Tumor Marker Latest Range: < 5.0 mIU/mL < 2.0    EXAM: CHEST 2 VIEW  COMPARISON: CT chest 06/20/2014  FINDINGS: The heart  size and mediastinal contours are within normal limits. Both lungs are clear. The visualized skeletal structures are unremarkable.  IMPRESSION: No active cardiopulmonary disease.    Impression and Plan:  49 year old gentleman with the following issues:  1. Nonseminomatous testicular cancer presented with a T2 N0 stage IB disease. He has a 40% embryonal component was lymphovascular invasion. He is S/P adjuvant chemotherapy in the form of 2 cycles of BEP. CT scan on 06/20/2014  showed no evidence of any cancer recurrence.  His chest x-ray and tumor markers from February 2016 were reviewed and showed no evidence of disease recurrence. The plan is to continue with active surveillance and repeat tumor marker and physical examination in 3 months. I will repeat imaging studies including CT scan of the abdomen and pelvis in August 2016. He will require physical examination, and tumor markers every 3 months until February 2017. This will be changed to every 6 months and your 3. We'll continue annual imaging studies of the abdomen and pelvis.   2. IV access:  Port-A-Cath was removed without any complications.  3. Small pulmonary nodule: Most likely benign but imaging studies including a chest CT will be repeated in August 2016.  4. followup: Will be in 3 months.     [facility], MD 2/11/20168:33 AM

## 2014-12-27 ENCOUNTER — Ambulatory Visit: Payer: Managed Care, Other (non HMO) | Admitting: Oncology

## 2015-03-20 ENCOUNTER — Other Ambulatory Visit (HOSPITAL_BASED_OUTPATIENT_CLINIC_OR_DEPARTMENT_OTHER): Payer: Managed Care, Other (non HMO)

## 2015-03-20 DIAGNOSIS — Z8547 Personal history of malignant neoplasm of testis: Secondary | ICD-10-CM | POA: Diagnosis not present

## 2015-03-20 DIAGNOSIS — C6290 Malignant neoplasm of unspecified testis, unspecified whether descended or undescended: Secondary | ICD-10-CM

## 2015-03-20 LAB — COMPREHENSIVE METABOLIC PANEL (CC13)
ALT: 19 U/L (ref 0–55)
ANION GAP: 9 meq/L (ref 3–11)
AST: 21 U/L (ref 5–34)
Albumin: 4.2 g/dL (ref 3.5–5.0)
Alkaline Phosphatase: 71 U/L (ref 40–150)
BUN: 14.4 mg/dL (ref 7.0–26.0)
CHLORIDE: 110 meq/L — AB (ref 98–109)
CO2: 24 meq/L (ref 22–29)
CREATININE: 1.3 mg/dL (ref 0.7–1.3)
Calcium: 9.4 mg/dL (ref 8.4–10.4)
EGFR: 66 mL/min/{1.73_m2} — AB (ref >90)
GLUCOSE: 86 mg/dL (ref 70–140)
POTASSIUM: 4.6 meq/L (ref 3.5–5.1)
Sodium: 143 mEq/L (ref 136–145)
Total Bilirubin: 0.62 mg/dL (ref 0.20–1.20)
Total Protein: 7 g/dL (ref 6.4–8.3)

## 2015-03-20 LAB — CBC WITH DIFFERENTIAL/PLATELET
BASO%: 0.5 % (ref 0.0–2.0)
Basophils Absolute: 0 10*3/uL (ref 0.0–0.1)
EOS%: 1.3 % (ref 0.0–7.0)
Eosinophils Absolute: 0.1 10*3/uL (ref 0.0–0.5)
HEMATOCRIT: 42.3 % (ref 38.4–49.9)
HGB: 14.1 g/dL (ref 13.0–17.1)
LYMPH%: 24.2 % (ref 14.0–49.0)
MCH: 29.4 pg (ref 27.2–33.4)
MCHC: 33.3 g/dL (ref 32.0–36.0)
MCV: 88.3 fL (ref 79.3–98.0)
MONO#: 0.3 10*3/uL (ref 0.1–0.9)
MONO%: 6.1 % (ref 0.0–14.0)
NEUT#: 3.8 10*3/uL (ref 1.5–6.5)
NEUT%: 67.9 % (ref 39.0–75.0)
Platelets: 155 10*3/uL (ref 140–400)
RBC: 4.79 10*6/uL (ref 4.20–5.82)
RDW: 13.2 % (ref 11.0–14.6)
WBC: 5.5 10*3/uL (ref 4.0–10.3)
lymph#: 1.3 10*3/uL (ref 0.9–3.3)

## 2015-03-20 LAB — LACTATE DEHYDROGENASE (CC13): LDH: 197 U/L (ref 125–245)

## 2015-03-21 ENCOUNTER — Other Ambulatory Visit: Payer: Managed Care, Other (non HMO)

## 2015-03-21 ENCOUNTER — Ambulatory Visit (HOSPITAL_BASED_OUTPATIENT_CLINIC_OR_DEPARTMENT_OTHER): Payer: Managed Care, Other (non HMO) | Admitting: Oncology

## 2015-03-21 ENCOUNTER — Telehealth: Payer: Self-pay | Admitting: Oncology

## 2015-03-21 VITALS — BP 122/83 | HR 57 | Temp 98.1°F | Resp 16 | Wt 232.1 lb

## 2015-03-21 DIAGNOSIS — R911 Solitary pulmonary nodule: Secondary | ICD-10-CM

## 2015-03-21 DIAGNOSIS — C6290 Malignant neoplasm of unspecified testis, unspecified whether descended or undescended: Secondary | ICD-10-CM

## 2015-03-21 DIAGNOSIS — Z8547 Personal history of malignant neoplasm of testis: Secondary | ICD-10-CM

## 2015-03-21 NOTE — Progress Notes (Signed)
Hematology and Oncology Follow Up Visit  Roberto Ruiz [id number] November 21, 1965 49 y.o. 03/21/2015 8:35 AM Roberto Ruiz MDBouska, [name], MD   Principle Diagnosis: 49 year old gentleman with stage IB nonseminomatous testis cancer presented with a T2 N0 mass in his left testicle. The pathology revealed mixed histology with 60% seminoma, 40% embryonal. Lymphovascular invasion was identified.   Prior Therapy:  He is status post left orchiectomy done on 11/13/2013. He is S/P adjuvant chemotherapy in the form of 2 cycles of BEP completed 01/30/2014.  Current therapy: Observation and surveillance.  Interim History: Roberto Ruiz presents today for a followup visit. Since the last visit, he continues to do very well. He has resumed all work-related duties without any decline. He does report occasional tinnitus. He does not have any residual toxicities related to chemotherapy. Did not report any peripheral neuropathy. Did not report any shortness of breath or difficulty breathing. Does report occasional cough but no exertional dyspnea. Did not report any hemoptysis or hematemesis. Did not have any hospitalizations or illnesses. He did not report any fevers or chills or sweats. Does not report any nausea or vomiting at this time. At that point any hematochezia or melena. Does not report any arthralgias or myalgias. He does not report any other neurological symptoms. He does not report any lymphadenopathy or petechiae. Rest of his review of system is unremarkable.  Medications: I have reviewed the patient's current medications. Unchanged by my review. No current outpatient prescriptions on file.   No current facility-administered medications for this visit.     Allergies:  Allergies  Allergen Reactions  . Compazine [Prochlorperazine Edisylate]     Caused sever shaking and leg cramps  . Sulfa Antibiotics Hives    Past Medical History, Surgical history, Social history, and Family History were reviewed  and updated.  Physical Exam: Blood pressure 122/83, pulse 57, temperature 98.1 F (36.7 C), temperature source Oral, resp. rate 16, weight 232 lb 1 oz (105.263 kg), SpO2 100 %. ECOG: 0 General appearance: alert and appears stated age Head: Normocephalic, no masses or lesions. Neck: no adenopathy Lymph nodes: Cervical, supraclavicular, and axillary nodes normal. Heart:regular rate and rhythm, S1, S2.  Lung:chest clear, no wheezing, rales. Good breath sounds. No dullness to percussion. Abdomin: soft, non-tender, without masses or organomegaly  EXT:no erythema, induration, or nodules no edema. Neurological: No deficits noted.  Lab Results: Lab Results  Component Value Date   WBC 5.5 03/20/2015   HGB 14.1 03/20/2015   HCT 42.3 03/20/2015   MCV 88.3 03/20/2015   PLT 155 03/20/2015     Chemistry      Component Value Date/Time   NA 143 03/20/2015 0824   NA 137 06/28/2014 1235   K 4.6 03/20/2015 0824   K 5.5* 06/28/2014 1235   CL 103 06/28/2014 1235   CO2 24 03/20/2015 0824   CO2 20 06/28/2014 1235   BUN 14.4 03/20/2015 0824   BUN 19 06/28/2014 1235   CREATININE 1.3 03/20/2015 0824   CREATININE 1.19 06/28/2014 1235      Component Value Date/Time   CALCIUM 9.4 03/20/2015 0824   CALCIUM 9.4 06/28/2014 1235   ALKPHOS 71 03/20/2015 0824   ALKPHOS 46 12/26/2013 0936   AST 21 03/20/2015 0824   AST 23 12/26/2013 0936   ALT 19 03/20/2015 0824   ALT 36 12/26/2013 0936   BILITOT 0.62 03/20/2015 0824   BILITOT 0.5 12/26/2013 0936     Results for Roberto Ruiz (MRN [id number]) as of 03/21/2015 08:21  Ref. Range 12/14/2014 08:08 12/14/2014 08:08 03/20/2015 08:25  AFP Tumor Marker Latest Ref Range: <6.1 ng/mL 3.8 3.6 3.3  Beta hCG, Tumor Marker Latest Ref Range: < 5.0 mIU/mL < 2.0       Impression and Plan:  49 year old gentleman with the following issues:  1. Nonseminomatous testicular cancer presented with a T2 N0 stage IB disease. He has a 40% embryonal component was  lymphovascular invasion. He is S/P adjuvant chemotherapy in the form of 2 cycles of BEP. CT scan on 06/20/2014  showed no evidence of any cancer recurrence. His chest x-ray and tumor markers from February 2016 showed no evidence of disease recurrence. His tumor markers from May 2016 as well as his physical exam did not show any evidence of recurrence.   The plan is to continue with active surveillance and repeat tumor marker and physical examination in 3 months. I will repeat imaging studies including CT scan of the abdomen and pelvis in August 2016. He will require physical examination, and tumor markers every 3 months until February 2017. This will be changed to every 6 months.    2. IV access:  Port-A-Cath was removed without any complications.  3. Small pulmonary nodule: Most likely benign but imaging studies including a chest CT will be repeated in August 2016.  4. followup: Will be in 3 months  with a repeat CT scan, physical examination and tumor markers.    [facility], MD 5/12/20168:35 AM

## 2015-03-21 NOTE — Telephone Encounter (Signed)
Gave and printed appt sched and avs for pt for Aug...gv barium////per Morey [employer] in radiology Christella Scheuermann is now in-network

## 2015-03-23 LAB — BETA HCG QUANT (REF LAB)

## 2015-03-23 LAB — AFP TUMOR MARKER: AFP-Tumor Marker: 3.3 ng/mL (ref <6.1)

## 2015-06-03 ENCOUNTER — Telehealth: Payer: Self-pay | Admitting: Oncology

## 2015-06-03 NOTE — Telephone Encounter (Signed)
Pt called to r/s labs due to CT was changed to another day. Pt confirmed labs ... KJ

## 2015-06-03 NOTE — Telephone Encounter (Signed)
Pt called to move labs closer to CT, pt confirmed new D/T... KJ

## 2015-06-25 ENCOUNTER — Other Ambulatory Visit: Payer: Managed Care, Other (non HMO)

## 2015-06-26 ENCOUNTER — Other Ambulatory Visit (HOSPITAL_BASED_OUTPATIENT_CLINIC_OR_DEPARTMENT_OTHER): Payer: Managed Care, Other (non HMO)

## 2015-06-26 ENCOUNTER — Other Ambulatory Visit: Payer: Self-pay | Admitting: Oncology

## 2015-06-26 ENCOUNTER — Ambulatory Visit (HOSPITAL_COMMUNITY)
Admission: RE | Admit: 2015-06-26 | Discharge: 2015-06-26 | Disposition: A | Discharge status: Home / Self Care | Payer: Managed Care, Other (non HMO) | Source: Ambulatory Visit | Attending: Oncology | Admitting: Oncology

## 2015-06-26 ENCOUNTER — Encounter (HOSPITAL_COMMUNITY): Payer: Self-pay

## 2015-06-26 DIAGNOSIS — Z9221 Personal history of antineoplastic chemotherapy: Secondary | ICD-10-CM | POA: Diagnosis not present

## 2015-06-26 DIAGNOSIS — Z8547 Personal history of malignant neoplasm of testis: Secondary | ICD-10-CM | POA: Diagnosis not present

## 2015-06-26 DIAGNOSIS — C6290 Malignant neoplasm of unspecified testis, unspecified whether descended or undescended: Secondary | ICD-10-CM

## 2015-06-26 DIAGNOSIS — Z9079 Acquired absence of other genital organ(s): Secondary | ICD-10-CM | POA: Insufficient documentation

## 2015-06-26 LAB — COMPREHENSIVE METABOLIC PANEL (CC13)
ALK PHOS: 71 U/L (ref 40–150)
ALT: 21 U/L (ref 0–55)
AST: 22 U/L (ref 5–34)
Albumin: 4.4 g/dL (ref 3.5–5.0)
Anion Gap: 9 mEq/L (ref 3–11)
BUN: 11.7 mg/dL (ref 7.0–26.0)
CHLORIDE: 109 meq/L (ref 98–109)
CO2: 24 meq/L (ref 22–29)
Calcium: 9.5 mg/dL (ref 8.4–10.4)
Creatinine: 1.2 mg/dL (ref 0.7–1.3)
EGFR: 70 mL/min/{1.73_m2} — AB (ref >90)
Glucose: 93 mg/dl (ref 70–140)
Potassium: 4.2 mEq/L (ref 3.5–5.1)
SODIUM: 142 meq/L (ref 136–145)
Total Bilirubin: 0.74 mg/dL (ref 0.20–1.20)
Total Protein: 7.3 g/dL (ref 6.4–8.3)

## 2015-06-26 LAB — CBC WITH DIFFERENTIAL/PLATELET
BASO%: 0.4 % (ref 0.0–2.0)
BASOS ABS: 0 10*3/uL (ref 0.0–0.1)
EOS ABS: 0.1 10*3/uL (ref 0.0–0.5)
EOS%: 1.4 % (ref 0.0–7.0)
HCT: 41.1 % (ref 38.4–49.9)
HGB: 13.6 g/dL (ref 13.0–17.1)
LYMPH%: 26.9 % (ref 14.0–49.0)
MCH: 29.2 pg (ref 27.2–33.4)
MCHC: 33.1 g/dL (ref 32.0–36.0)
MCV: 88.2 fL (ref 79.3–98.0)
MONO#: 0.4 10*3/uL (ref 0.1–0.9)
MONO%: 6.9 % (ref 0.0–14.0)
NEUT#: 3.3 10*3/uL (ref 1.5–6.5)
NEUT%: 64.4 % (ref 39.0–75.0)
Platelets: 162 10*3/uL (ref 140–400)
RBC: 4.66 10*6/uL (ref 4.20–5.82)
RDW: 13.3 % (ref 11.0–14.6)
WBC: 5.1 10*3/uL (ref 4.0–10.3)
lymph#: 1.4 10*3/uL (ref 0.9–3.3)

## 2015-06-26 LAB — LACTATE DEHYDROGENASE (CC13): LDH: 226 U/L (ref 125–245)

## 2015-06-26 MED ORDER — IOHEXOL 300 MG/ML  SOLN
100.0000 mL | Freq: Once | INTRAMUSCULAR | Status: AC | PRN
Start: 2015-06-26 — End: 2015-06-26
  Administered 2015-06-26: 100 mL via INTRAVENOUS

## 2015-06-27 ENCOUNTER — Ambulatory Visit (HOSPITAL_BASED_OUTPATIENT_CLINIC_OR_DEPARTMENT_OTHER): Payer: Managed Care, Other (non HMO) | Admitting: Oncology

## 2015-06-27 ENCOUNTER — Telehealth: Payer: Self-pay | Admitting: Oncology

## 2015-06-27 VITALS — BP 122/66 | HR 49 | Temp 98.4°F | Resp 18 | Ht 68.0 in | Wt 228.7 lb

## 2015-06-27 DIAGNOSIS — C6290 Malignant neoplasm of unspecified testis, unspecified whether descended or undescended: Secondary | ICD-10-CM

## 2015-06-27 DIAGNOSIS — Z8547 Personal history of malignant neoplasm of testis: Secondary | ICD-10-CM | POA: Diagnosis not present

## 2015-06-27 DIAGNOSIS — R911 Solitary pulmonary nodule: Secondary | ICD-10-CM

## 2015-06-27 NOTE — Telephone Encounter (Signed)
Gave and printed appts ched and avs for pt for DEC  °

## 2015-06-27 NOTE — Progress Notes (Signed)
Hematology and Oncology Follow Up Visit  Roberto Ruiz [id number] 26-Apr-1966 49 y.o. 06/27/2015 8:25 AM [name] MDBouska, David, MD   Principle Diagnosis: 49 year old gentleman with stage IB nonseminomatous testis cancer presented with a T2 N0 mass in his left testicle. The pathology revealed mixed histology with 60% seminoma, 40% embryonal. Lymphovascular invasion was identified.   Prior Therapy:  He is status post left orchiectomy done on 11/13/2013. He is S/P adjuvant chemotherapy in the form of 2 cycles of BEP completed 01/30/2014.  Current therapy: Observation and surveillance.  Interim History: Roberto Ruiz presents today for a followup visit. Since the last visit, he reports no complaints today. He does not report any signs or symptoms of cancer recurrence.  He has continued all work-related duties without any decline. He does report occasional tinnitus. He does not have any residual toxicities related to chemotherapy. Did not report any peripheral neuropathy. Did not report any shortness of breath or difficulty breathing. Does report occasional cough but no exertional dyspnea. Did not report any hemoptysis or hematemesis. Did not have any hospitalizations or illnesses. He continues to have occasional tinnitus but have not changed dramatically.  He did not report any fevers or chills or sweats. Does not report any nausea or vomiting at this time. At that point any hematochezia or melena. Does not report any arthralgias or myalgias. He does not report any other neurological symptoms. He does not report any lymphadenopathy or petechiae. Rest of his review of system is unremarkable.  Medications: I have reviewed the patient's current medications. Unchanged by my review. No current outpatient prescriptions on file.   No current facility-administered medications for this visit.     Allergies:  Allergies  Allergen Reactions  . Compazine [Prochlorperazine Edisylate]     Caused sever  shaking and leg cramps  . Sulfa Antibiotics Hives    Past Medical History, Surgical history, Social history, and Family History were reviewed and updated.  Physical Exam: Blood pressure 122/66, pulse 49, temperature 98.4 F (36.9 C), temperature source Oral, resp. rate 18, height 5\' 8"  (1.727 m), weight 228 lb 11.2 oz (103.738 kg), SpO2 100 %. ECOG: 0 General appearance: alert and appears stated age. Not in any distress today. Head: Normocephalic, no masses or lesions. Neck: no adenopathy Lymph nodes: Cervical, supraclavicular, and axillary nodes normal. Heart:regular rate and rhythm, S1, S2.  Lung:chest clear, no wheezing, rales. Good breath sounds. No dullness to percussion. Abdomin: soft, non-tender, without masses or organomegaly  EXT:no erythema, induration, or nodules no edema. Neurological: No deficits noted.  Lab Results: Lab Results  Component Value Date   WBC 5.1 06/26/2015   HGB 13.6 06/26/2015   HCT 41.1 06/26/2015   MCV 88.2 06/26/2015   PLT 162 06/26/2015     Chemistry      Component Value Date/Time   NA 142 06/26/2015 1028   NA 137 06/28/2014 1235   K 4.2 06/26/2015 1028   K 5.5* 06/28/2014 1235   CL 103 06/28/2014 1235   CO2 24 06/26/2015 1028   CO2 20 06/28/2014 1235   BUN 11.7 06/26/2015 1028   BUN 19 06/28/2014 1235   CREATININE 1.2 06/26/2015 1028   CREATININE 1.19 06/28/2014 1235      Component Value Date/Time   CALCIUM 9.5 06/26/2015 1028   CALCIUM 9.4 06/28/2014 1235   ALKPHOS 71 06/26/2015 1028   ALKPHOS 46 12/26/2013 0936   AST 22 06/26/2015 1028   AST 23 12/26/2013 0936   ALT 21 06/26/2015 1028  ALT 36 12/26/2013 0936   BILITOT 0.74 06/26/2015 1028   BILITOT 0.5 12/26/2013 0936      Results for Roberto Ruiz, Roberto Ruiz (MRN [id number]) as of 06/27/2015 07:57  Ref. Range 03/20/2015 08:25  AFP Tumor Marker Latest Ref Range: <6.1 ng/mL 3.3  Beta hCG, Tumor Marker Latest Ref Range: < 5.0 mIU/mL < 2.0    EXAM: CT CHEST, ABDOMEN AND PELVIS  WITHOUT CONTRAST  TECHNIQUE: Multidetector CT imaging of the chest, abdomen and pelvis was performed following the standard protocol without IV contrast.  COMPARISON: CT chest abdomen pelvis dated 06/20/2014.  FINDINGS: Please note that the patient's IV extravasated after only 10 mL of intravenous contrast was administered. The patient was essentially scanned without contrast administration. Repeat imaging was not performed.  CT CHEST FINDINGS  Mediastinum/Nodes: The heart is normal in size. No pericardial effusion. Stable fluid along the superior pericardial recess.  Mild atherosclerotic calcifications at the aortic arch/root.  No suspicious mediastinal or axillary lymphadenopathy.  Visualized thyroid is unremarkable.  Lungs/Pleura: Lungs are essentially clear.  No suspicious pulmonary nodules.  No focal consolidation.  No pleural effusion or pneumothorax.  Musculoskeletal: Visualized osseous structures are within normal limits.  CT ABDOMEN PELVIS FINDINGS  Hepatobiliary: Unenhanced liver is unremarkable.  Gallbladder is unremarkable. No intrahepatic or extrahepatic ductal dilatation.  Pancreas: Within normal limits.  Spleen: Within normal limits.  Adrenals/Urinary Tract: Adrenal glands are within normal limits.  Kidneys are within normal limits.  No renal, ureteral, or bladder calculi. No hydronephrosis.  Bladder is within normal limits.  Stomach/Bowel: Stomach is within normal limits.  No evidence of bowel obstruction.  Normal appendix.  Colonic diverticulosis, without evidence of diverticulitis.  Vascular/Lymphatic: No evidence of abdominal aortic aneurysm.  No suspicious abdominopelvic lymphadenopathy.  Reproductive: Prostate is unremarkable.  Status post left orchiectomy.  Other: No abdominopelvic ascites.  Musculoskeletal: Mild degenerative changes at T12-L1.  IMPRESSION: Status post left  orchiectomy.  No evidence of recurrent or metastatic disease.  Impression and Plan:  49 year old gentleman with the following issues:  1. Nonseminomatous testicular cancer presented with a T2 N0 stage IB disease. He has a 40% embryonal component was lymphovascular invasion. He is S/P adjuvant chemotherapy in the form of 2 cycles of BEP.   CT scan on 06/26/2015 was reviewed today and showed no evidence of any cancer recurrence. His tumor markers all within normal range.   The plan is to continue with active surveillance and repeat tumor marker and physical examination in 4 months. I will repeat imaging studies including CT scan of the abdomen and pelvis in August 2017 and on annual basis after that.   2. IV access:  Port-A-Cath was removed without any complications.  3. Small pulmonary nodule: Most likely benign. CT scan of the chest on 06/26/2015 did not reveal any abnormalities.  4. followup: Will be in 4 months  with a repeat CT scan, physical examination and tumor markers.    [facility], MD 8/18/20168:25 AM

## 2015-06-29 LAB — BETA HCG QUANT (REF LAB): Beta hCG, Tumor Marker: <2 m[IU]/mL (ref <5.0)

## 2015-06-29 LAB — AFP TUMOR MARKER: AFP-Tumor Marker: 3.2 ng/mL (ref <6.1)

## 2015-08-02 ENCOUNTER — Telehealth: Payer: Self-pay | Admitting: Oncology

## 2015-08-02 NOTE — Telephone Encounter (Signed)
Pt called to r/s labs/ov due to work schedule, s/w pt confirming new D/T... KJ

## 2015-08-15 ENCOUNTER — Ambulatory Visit (INDEPENDENT_AMBULATORY_CARE_PROVIDER_SITE_OTHER): Payer: Managed Care, Other (non HMO)

## 2015-08-15 ENCOUNTER — Ambulatory Visit (INDEPENDENT_AMBULATORY_CARE_PROVIDER_SITE_OTHER): Payer: Managed Care, Other (non HMO) | Admitting: Podiatry

## 2015-08-15 ENCOUNTER — Encounter: Payer: Self-pay | Admitting: Podiatry

## 2015-08-15 VITALS — BP 119/65 | HR 52 | Resp 16

## 2015-08-15 DIAGNOSIS — B353 Tinea pedis: Secondary | ICD-10-CM | POA: Diagnosis not present

## 2015-08-15 DIAGNOSIS — M722 Plantar fascial fibromatosis: Secondary | ICD-10-CM

## 2015-08-15 DIAGNOSIS — M7751 Other enthesopathy of right foot: Secondary | ICD-10-CM | POA: Diagnosis not present

## 2015-08-15 DIAGNOSIS — M779 Enthesopathy, unspecified: Secondary | ICD-10-CM

## 2015-08-15 DIAGNOSIS — M778 Other enthesopathies, not elsewhere classified: Secondary | ICD-10-CM

## 2015-08-15 MED ORDER — TERBINAFINE HCL 250 MG PO TABS: 250.0000 mg | ORAL_TABLET | Freq: Every day | ORAL | Status: DC

## 2015-08-15 MED ORDER — METHYLPREDNISOLONE 4 MG PO TBPK: ORAL_TABLET | ORAL | Status: DC

## 2015-08-15 MED ORDER — MELOXICAM 15 MG PO TABS: 15.0000 mg | ORAL_TABLET | Freq: Every day | ORAL | Status: DC

## 2015-08-15 NOTE — Progress Notes (Signed)
   Subjective:    Patient ID: Roberto Ruiz, male    DOB: [birth year], 49 y.o.   MRN: [id number]  HPI Comments: "I got something going on with this foot"  Patient presents with: Foot Pain: Lateral and dorsal midfoot right - aching for 4 months, some swelling off and on, no injury, no home treatment.    Foot Pain   he denies trauma. He states he has a rash and plantar aspect of the bilateral foot does not itch. He has tried nothing for the rash.  Review of Systems  HENT: Positive for hearing loss and tinnitus.   Musculoskeletal: Positive for gait problem.  All other systems reviewed and are negative.      Objective:   Physical Exam: 49 year old white male presents vital signs stable alert and oriented 3 in no apparent distress. Pulses are strongly palpable bilateral. Neurologic sensorium is intact per Semmes-Weinstein monofilament. Deep tendon reflexes are intact. Muscle strength is 5 over 5 dorsiflexion plantar flexors and inverters everters on physical musculatures intact. Orthopedic evaluation of his straights all joints distal to the ankle have a full range of motion without crepitation. He has pain from plain range of motion particularly overlying the fourth fifth metatarsocuboid articulation angle. He also has some tenderness on palpation of the peroneal tendons along the lateral side. Cutaneous evaluationsupple well-hydrated cutis no erythema edema saline strength. He does have a rash resembling tinea pedis to the plantar aspect of the bilateral foot.        Assessment & Plan:  Capsulitis fourth fifth met cuboid articulation right foot. Peroneal tendinitis right foot.  Plan: I injected the area today with Kenalog to the point of maximal tenderness. Discussed appropriate shoe gear stretching exercises ice therapy and shoe modifications. Dispensed a prescription for Medrol to be followed by meloxicam. I also started him on Lamisil 250 mg tablets 1 by mouth daily 1 month  only.  Roselind Messier DPM

## 2015-08-29 ENCOUNTER — Ambulatory Visit: Payer: Managed Care, Other (non HMO) | Admitting: Podiatry

## 2015-10-16 ENCOUNTER — Other Ambulatory Visit: Payer: Managed Care, Other (non HMO)

## 2015-10-16 ENCOUNTER — Ambulatory Visit: Payer: Managed Care, Other (non HMO) | Admitting: Oncology

## 2015-10-17 ENCOUNTER — Telehealth: Payer: Self-pay | Admitting: Oncology

## 2015-10-17 ENCOUNTER — Ambulatory Visit (HOSPITAL_BASED_OUTPATIENT_CLINIC_OR_DEPARTMENT_OTHER): Payer: Managed Care, Other (non HMO) | Admitting: Oncology

## 2015-10-17 ENCOUNTER — Other Ambulatory Visit (HOSPITAL_BASED_OUTPATIENT_CLINIC_OR_DEPARTMENT_OTHER): Payer: Managed Care, Other (non HMO)

## 2015-10-17 VITALS — BP 122/79 | HR 63 | Temp 98.1°F | Resp 18 | Ht 68.0 in | Wt 232.7 lb

## 2015-10-17 DIAGNOSIS — C6292 Malignant neoplasm of left testis, unspecified whether descended or undescended: Secondary | ICD-10-CM

## 2015-10-17 DIAGNOSIS — R911 Solitary pulmonary nodule: Secondary | ICD-10-CM | POA: Diagnosis not present

## 2015-10-17 DIAGNOSIS — C6290 Malignant neoplasm of unspecified testis, unspecified whether descended or undescended: Secondary | ICD-10-CM

## 2015-10-17 LAB — CBC WITH DIFFERENTIAL/PLATELET
BASO%: 0.5 % (ref 0.0–2.0)
Basophils Absolute: 0 10*3/uL (ref 0.0–0.1)
EOS%: 1.5 % (ref 0.0–7.0)
Eosinophils Absolute: 0.1 10*3/uL (ref 0.0–0.5)
HEMATOCRIT: 41.4 % (ref 38.4–49.9)
HGB: 13.9 g/dL (ref 13.0–17.1)
LYMPH#: 1.6 10*3/uL (ref 0.9–3.3)
LYMPH%: 25.9 % (ref 14.0–49.0)
MCH: 29.6 pg (ref 27.2–33.4)
MCHC: 33.6 g/dL (ref 32.0–36.0)
MCV: 88.1 fL (ref 79.3–98.0)
MONO#: 0.5 10*3/uL (ref 0.1–0.9)
MONO%: 8.7 % (ref 0.0–14.0)
NEUT#: 3.9 10*3/uL (ref 1.5–6.5)
NEUT%: 63.4 % (ref 39.0–75.0)
Platelets: 175 10*3/uL (ref 140–400)
RBC: 4.7 10*6/uL (ref 4.20–5.82)
RDW: 13.3 % (ref 11.0–14.6)
WBC: 6.1 10*3/uL (ref 4.0–10.3)

## 2015-10-17 LAB — COMPREHENSIVE METABOLIC PANEL
ALT: 17 U/L (ref 0–55)
AST: 20 U/L (ref 5–34)
Albumin: 4.1 g/dL (ref 3.5–5.0)
Alkaline Phosphatase: 68 U/L (ref 40–150)
Anion Gap: 11 mEq/L (ref 3–11)
BUN: 17.2 mg/dL (ref 7.0–26.0)
CHLORIDE: 108 meq/L (ref 98–109)
CO2: 23 meq/L (ref 22–29)
Calcium: 9.8 mg/dL (ref 8.4–10.4)
Creatinine: 1.3 mg/dL (ref 0.7–1.3)
EGFR: 63 mL/min/{1.73_m2} — ABNORMAL LOW (ref >90)
GLUCOSE: 92 mg/dL (ref 70–140)
POTASSIUM: 4.4 meq/L (ref 3.5–5.1)
SODIUM: 142 meq/L (ref 136–145)
Total Bilirubin: 0.61 mg/dL (ref 0.20–1.20)
Total Protein: 7.2 g/dL (ref 6.4–8.3)

## 2015-10-17 LAB — LACTATE DEHYDROGENASE: LDH: 196 U/L (ref 125–245)

## 2015-10-17 NOTE — Telephone Encounter (Signed)
Gave and printed appt sched and avs for pt for April 2017 °

## 2015-10-17 NOTE — Progress Notes (Signed)
Hematology and Oncology Follow Up Visit  IVERSON CANNOY [id number] 08-29-1966 49 y.o. 10/17/2015 8:18 AM [name], MDBouska, [name], MD   Principle Diagnosis: 49 year old gentleman with stage IB nonseminomatous testis cancer presented with a T2 N0 mass in his left testicle. The pathology revealed mixed histology with 60% seminoma, 40% embryonal. Lymphovascular invasion was identified.   Prior Therapy:  He is status post left orchiectomy done on 11/13/2013. He is S/P adjuvant chemotherapy in the form of 2 cycles of BEP completed 01/30/2014.  Current therapy: Observation and surveillance.  Interim History: Mr. Schill presents today for a followup visit. Since the last visit, he did he is to have residual tinnitus which have not changed dramatically. He has no other complaints at this time. He does not report any signs or symptoms of cancer recurrence.  He has continued all work-related duties without any decline. He did have a minor orthopedic issue that limited his mobility and caused him to gain weight. He denied any lymphadenopathy in the neck or the groin area.  He does not have any other residual toxicities related to chemotherapy. Did not report any peripheral neuropathy. Did not report any shortness of breath or difficulty breathing. Does report occasional cough but no exertional dyspnea. Did not report any hemoptysis or hematemesis.  He did not report any fevers or chills or sweats. Does not report any nausea or vomiting at this time. At that point any hematochezia or melena. Does not report any arthralgias or myalgias. He does not report any other neurological symptoms. He does not report any lymphadenopathy or petechiae. Rest of his review of system is unremarkable.  Medications: I have reviewed the patient's current medications. He takes none at this time.    Allergies:  Allergies  Allergen Reactions  . Compazine [Prochlorperazine Edisylate] Other (See Comments)    Caused  severe shaking and leg cramps  . Sulfa Antibiotics Hives    Past Medical History, Surgical history, Social history, and Family History were reviewed and updated.  Physical Exam: Blood pressure 122/79, pulse 63, temperature 98.1 F (36.7 C), temperature source Oral, resp. rate 18, height 5\' 8"  (1.727 m), weight 232 lb 11.2 oz (105.552 kg), SpO2 100 %. ECOG: 0 General appearance: alert and appears stated age. Well-appearing. Head: Normocephalic, no masses or lesions. Neck: no adenopathy Lymph nodes: Cervical, supraclavicular, and axillary nodes normal. Heart:regular rate and rhythm, S1, S2.  Lung:chest clear, no wheezing, rales. No dullness to percussion. Abdomin: soft, non-tender, without masses or organomegaly no shifting dullness or ascites. EXT:no erythema, induration, or nodules no edema. Neurological: No deficits noted.  Lab Results: Lab Results  Component Value Date   WBC 6.1 10/17/2015   HGB 13.9 10/17/2015   HCT 41.4 10/17/2015   MCV 88.1 10/17/2015   PLT 175 10/17/2015     Chemistry      Component Value Date/Time   NA 142 06/26/2015 1028   NA 137 06/28/2014 1235   K 4.2 06/26/2015 1028   K 5.5* 06/28/2014 1235   CL 103 06/28/2014 1235   CO2 24 06/26/2015 1028   CO2 20 06/28/2014 1235   BUN 11.7 06/26/2015 1028   BUN 19 06/28/2014 1235   CREATININE 1.2 06/26/2015 1028   CREATININE 1.19 06/28/2014 1235      Component Value Date/Time   CALCIUM 9.5 06/26/2015 1028   CALCIUM 9.4 06/28/2014 1235   ALKPHOS 71 06/26/2015 1028   ALKPHOS 46 12/26/2013 0936   AST 22 06/26/2015 1028   AST 23 12/26/2013  0936   ALT 21 06/26/2015 1028   ALT 36 12/26/2013 0936   BILITOT 0.74 06/26/2015 1028   BILITOT 0.5 12/26/2013 0936     Results for NAS, DUDZIAK (MRN [id number]) as of 10/17/2015 08:07  Ref. Range 06/26/2015 10:27  AFP Tumor Marker Latest Ref Range: <6.1 ng/mL 3.2  Beta hCG, Tumor Marker Latest Ref Range: < 5.0 mIU/mL < 2.0     Impression and  Plan:  49 year old gentleman with the following issues:  1. Nonseminomatous testicular cancer presented with a T2 N0 stage IB disease. He has a 40% embryonal component was lymphovascular invasion. He is S/P adjuvant chemotherapy in the form of 2 cycles of BEP.   CT scan on 06/26/2015 showed no evidence of any cancer recurrence. His tumor markers from 06/26/2015 continues to be within normal range. They are currently pending from today.   The plan is to continue with active surveillance and repeat tumor marker and physical examination in 4 months. I will repeat imaging studies including CT scan of the abdomen and pelvis in August 2017 and on annual basis after that. If his tumor markers rise at any point we will obtain CT scan at that time.   2. IV access:  Port-A-Cath was removed without any complications.  3. Small pulmonary nodule: Most likely benign. CT scan of the chest on 06/26/2015 did not reveal any abnormalities.  4. Followup: Will be in 4 months. He will have repeat physical examination and laboratory testing at that time.    [facility], MD 12/8/20168:18 AM

## 2015-10-20 LAB — AFP TUMOR MARKER: AFP TUMOR MARKER: 3.8 ng/mL (ref <6.1)

## 2015-10-20 LAB — BETA HCG QUANT (REF LAB)

## 2016-02-19 ENCOUNTER — Other Ambulatory Visit (HOSPITAL_BASED_OUTPATIENT_CLINIC_OR_DEPARTMENT_OTHER): Payer: Managed Care, Other (non HMO)

## 2016-02-19 ENCOUNTER — Ambulatory Visit (HOSPITAL_BASED_OUTPATIENT_CLINIC_OR_DEPARTMENT_OTHER): Payer: Managed Care, Other (non HMO) | Admitting: Oncology

## 2016-02-19 ENCOUNTER — Telehealth: Payer: Self-pay | Admitting: Oncology

## 2016-02-19 VITALS — BP 129/75 | HR 60 | Temp 98.7°F | Resp 18 | Wt 232.0 lb

## 2016-02-19 DIAGNOSIS — C6292 Malignant neoplasm of left testis, unspecified whether descended or undescended: Secondary | ICD-10-CM

## 2016-02-19 LAB — CBC WITH DIFFERENTIAL/PLATELET
BASO%: 0.5 % (ref 0.0–2.0)
Basophils Absolute: 0 10*3/uL (ref 0.0–0.1)
EOS%: 1.2 % (ref 0.0–7.0)
Eosinophils Absolute: 0.1 10*3/uL (ref 0.0–0.5)
HCT: 42.5 % (ref 38.4–49.9)
HGB: 13.9 g/dL (ref 13.0–17.1)
LYMPH%: 16.5 % (ref 14.0–49.0)
MCH: 28.4 pg (ref 27.2–33.4)
MCHC: 32.7 g/dL (ref 32.0–36.0)
MCV: 86.9 fL (ref 79.3–98.0)
MONO#: 0.6 10*3/uL (ref 0.1–0.9)
MONO%: 7.6 % (ref 0.0–14.0)
NEUT#: 5.5 10*3/uL (ref 1.5–6.5)
NEUT%: 74.2 % (ref 39.0–75.0)
Platelets: 149 10*3/uL (ref 140–400)
RBC: 4.9 10*6/uL (ref 4.20–5.82)
RDW: 13.8 % (ref 11.0–14.6)
WBC: 7.4 10*3/uL (ref 4.0–10.3)
lymph#: 1.2 10*3/uL (ref 0.9–3.3)

## 2016-02-19 LAB — COMPREHENSIVE METABOLIC PANEL
ALT: 16 U/L (ref 0–55)
AST: 17 U/L (ref 5–34)
Albumin: 3.9 g/dL (ref 3.5–5.0)
Alkaline Phosphatase: 63 U/L (ref 40–150)
Anion Gap: 9 mEq/L (ref 3–11)
BUN: 11.6 mg/dL (ref 7.0–26.0)
CO2: 23 mEq/L (ref 22–29)
Calcium: 9.6 mg/dL (ref 8.4–10.4)
Chloride: 109 mEq/L (ref 98–109)
Creatinine: 1.4 mg/dL — ABNORMAL HIGH (ref 0.7–1.3)
EGFR: 58 mL/min/{1.73_m2} — ABNORMAL LOW (ref >90)
Glucose: 99 mg/dl (ref 70–140)
Potassium: 4.3 mEq/L (ref 3.5–5.1)
Sodium: 141 mEq/L (ref 136–145)
Total Bilirubin: 1.02 mg/dL (ref 0.20–1.20)
Total Protein: 7.3 g/dL (ref 6.4–8.3)

## 2016-02-19 LAB — LACTATE DEHYDROGENASE: LDH: 210 U/L (ref 125–245)

## 2016-02-19 NOTE — Telephone Encounter (Signed)
Gave and printed appt sched and avs for pt for OCT/.///gv barium °

## 2016-02-19 NOTE — Progress Notes (Signed)
Hematology and Oncology Follow Up Visit  Roberto Ruiz [id number] 11-Sep-1966 50 y.o. 02/19/2016 8:30 AM Roberto Ruiz, Roberto Brow, MD   Principle Diagnosis: 50 year old gentleman with stage IB nonseminomatous testis cancer presented with a T2 N0 mass in his left testicle. The pathology revealed mixed histology with 60% seminoma, 40% embryonal. Lymphovascular invasion was identified.   Prior Therapy:  He is status post left orchiectomy done on 11/13/2013. He is S/P adjuvant chemotherapy in the form of 2 cycles of BEP completed 01/30/2014.  Current therapy: Observation and surveillance.  Interim History: Mr. Roberto Ruiz presents today for a followup visit. Since the last visit, he reports doing very well without any residual side effects of chemotherapy. His tinnitus have nearly resolved at this time. He did report fluid poisoning which currently he has recovered from. He reports eating spinach which causes him severe diarrhea which have resolved.  He has continued all work-related duties without any decline. He did have a minor orthopedic issue that limited his mobility and caused him to gain weight. He denied any lymphadenopathy in the neck or the groin area.  He is reporting a small skin nodule around the left midaxillary line that appeared in the last week. No associated with pain or trauma. Did not see any drainage or erythema.  He does not report any headaches, blurry vision, syncope or seizures. He did not report any fevers or chills or sweats. He does not report any chest pain, palpitation orthopnea or leg edema. He reports no hematochezia or melena. Does not report any arthralgias or myalgias. He does not report any lymphadenopathy or petechiae. Rest of his review of system is unremarkable.  Medications: I have reviewed the patient's current medications. He takes none at this time.    Allergies:  Allergies  Allergen Reactions  . Compazine [Prochlorperazine Roberto Ruiz] Other (See  Comments)    Caused severe shaking and leg cramps  . Sulfa Antibiotics Hives    Past Medical History, Surgical history, Social history, and Family History were reviewed and updated.  Physical Exam: Blood pressure 129/75, pulse 60, temperature 98.7 F (37.1 C), temperature source Oral, resp. rate 18, weight 232 lb (105.235 kg), SpO2 100 %. ECOG: 0 General appearance: Alert, awake gentleman without distress. Head: Normocephalic, no masses or lesions. Oral mucosae showed no ulcers or lesions. Neck: no adenopathy Lymph nodes: Cervical, supraclavicular, and axillary nodes normal. Heart:regular rate and rhythm, S1, S2. No murmurs rubs or gallops. Lung:chest clear, no wheezing, rales. No dullness to percussion. Abdomin: soft, non-tender, without masses or organomegaly no rebound or guarding. EXT:no erythema, induration, or nodules no edema. Neurological: No deficits noted. Skin examination: No rashes noted a slight protrusion noted on the mid axillary line on the left side. The protrusion is soft and mobile appears to be subcutaneous. It was pea size and non-tender.  Lab Results: Lab Results  Component Value Date   WBC 7.4 02/19/2016   HGB 13.9 02/19/2016   HCT 42.5 02/19/2016   MCV 86.9 02/19/2016   PLT 149 02/19/2016     Chemistry      Component Value Date/Time   NA 142 10/17/2015 0759   NA 137 06/28/2014 1235   K 4.4 10/17/2015 0759   K 5.5* 06/28/2014 1235   CL 103 06/28/2014 1235   CO2 23 10/17/2015 0759   CO2 20 06/28/2014 1235   BUN 17.2 10/17/2015 0759   BUN 19 06/28/2014 1235   CREATININE 1.3 10/17/2015 0759   CREATININE 1.19 06/28/2014 1235  Component Value Date/Time   CALCIUM 9.8 10/17/2015 0759   CALCIUM 9.4 06/28/2014 1235   ALKPHOS 68 10/17/2015 0759   ALKPHOS 46 12/26/2013 0936   AST 20 10/17/2015 0759   AST 23 12/26/2013 0936   ALT 17 10/17/2015 0759   ALT 36 12/26/2013 0936   BILITOT 0.61 10/17/2015 0759   BILITOT 0.5 12/26/2013 0936      Results for Roberto Ruiz, Roberto Ruiz (MRN [id number]) as of 10/17/2015 08:07  Ref. Range 06/26/2015 10:27  AFP Tumor Marker Latest Ref Range: <6.1 ng/mL 3.2  Beta hCG, Tumor Marker Latest Ref Range: < 5.0 mIU/mL < 2.0     Impression and Plan:  50 year old gentleman with the following issues:  1. Nonseminomatous testicular cancer presented with a T2 N0 stage IB disease. He has a 40% embryonal component was lymphovascular invasion. He is S/P adjuvant chemotherapy in the form of 2 cycles of BEP.   CT scan on 06/26/2015 showed no evidence of any cancer recurrence. His tumor markers from 06/26/2015 continues to be within normal range.   The plan is to continue active surveillance and stretch his visits to every 6 months with physical examination and laboratory testing. CT scan will be repeated every 6-12 months. Given his recent examination and finding of a subcutaneous mass, we will obtain CT scan in the near future and repeat in 6-12 months depending on the findings.   2. IV access:  Port-A-Cath was removed without any complications.  3. Small pulmonary nodule: Most likely benign. CT scan of the chest on 06/26/2015 did not reveal any abnormalities. This will be repeated to ensure stability.  4. Followup: Will be in 6 months. He will have repeat physical examination and laboratory testing at that time.    [facility], MD 4/12/20178:30 AM

## 2016-02-20 LAB — BETA HCG QUANT (REF LAB)

## 2016-02-20 LAB — AFP TUMOR MARKER: AFP, SERUM, TUMOR MARKER: 3.4 ng/mL (ref 0.0–8.3)

## 2016-02-22 LAB — ALPHA FETO PROTEIN (PARALLEL TESTING): AFP-Tumor Marker: 3.1 ng/mL (ref <6.1)

## 2016-02-22 LAB — BETA HCG, QN (PARALLEL TESTING): Beta hCG, Tumor Marker: <2 m[IU]/mL (ref <5.0)

## 2016-02-24 ENCOUNTER — Ambulatory Visit (HOSPITAL_COMMUNITY)
Admission: RE | Admit: 2016-02-24 | Discharge: 2016-02-24 | Disposition: A | Discharge status: Home / Self Care | Payer: Managed Care, Other (non HMO) | Source: Ambulatory Visit | Attending: Oncology | Admitting: Oncology

## 2016-02-24 ENCOUNTER — Encounter (HOSPITAL_COMMUNITY): Payer: Self-pay

## 2016-02-24 DIAGNOSIS — J9811 Atelectasis: Secondary | ICD-10-CM | POA: Insufficient documentation

## 2016-02-24 DIAGNOSIS — R222 Localized swelling, mass and lump, trunk: Secondary | ICD-10-CM | POA: Diagnosis not present

## 2016-02-24 DIAGNOSIS — C6292 Malignant neoplasm of left testis, unspecified whether descended or undescended: Secondary | ICD-10-CM | POA: Diagnosis present

## 2016-02-24 DIAGNOSIS — R918 Other nonspecific abnormal finding of lung field: Secondary | ICD-10-CM | POA: Diagnosis not present

## 2016-02-24 MED ORDER — IOPAMIDOL (ISOVUE-300) INJECTION 61%
100.0000 mL | Freq: Once | INTRAVENOUS | Status: AC | PRN
  Administered 2016-02-24: 100 mL via INTRAVENOUS

## 2016-08-26 ENCOUNTER — Other Ambulatory Visit (HOSPITAL_BASED_OUTPATIENT_CLINIC_OR_DEPARTMENT_OTHER): Payer: Managed Care, Other (non HMO)

## 2016-08-26 ENCOUNTER — Telehealth: Payer: Self-pay | Admitting: Oncology

## 2016-08-26 ENCOUNTER — Ambulatory Visit (HOSPITAL_BASED_OUTPATIENT_CLINIC_OR_DEPARTMENT_OTHER): Payer: Managed Care, Other (non HMO) | Admitting: Oncology

## 2016-08-26 VITALS — BP 111/75 | HR 62 | Temp 98.2°F | Resp 18 | Ht 68.0 in | Wt 225.6 lb

## 2016-08-26 DIAGNOSIS — C6292 Malignant neoplasm of left testis, unspecified whether descended or undescended: Secondary | ICD-10-CM

## 2016-08-26 DIAGNOSIS — C779 Secondary and unspecified malignant neoplasm of lymph node, unspecified: Secondary | ICD-10-CM | POA: Diagnosis not present

## 2016-08-26 LAB — CBC WITH DIFFERENTIAL/PLATELET
BASO%: 0.4 % (ref 0.0–2.0)
Basophils Absolute: 0 10*3/uL (ref 0.0–0.1)
EOS ABS: 0.1 10*3/uL (ref 0.0–0.5)
EOS%: 1.7 % (ref 0.0–7.0)
HCT: 41.8 % (ref 38.4–49.9)
HGB: 14 g/dL (ref 13.0–17.1)
LYMPH%: 28.7 % (ref 14.0–49.0)
MCH: 28.9 pg (ref 27.2–33.4)
MCHC: 33.5 g/dL (ref 32.0–36.0)
MCV: 86.2 fL (ref 79.3–98.0)
MONO#: 0.5 10*3/uL (ref 0.1–0.9)
MONO%: 8.5 % (ref 0.0–14.0)
NEUT#: 3.2 10*3/uL (ref 1.5–6.5)
NEUT%: 60.7 % (ref 39.0–75.0)
PLATELETS: 161 10*3/uL (ref 140–400)
RBC: 4.85 10*6/uL (ref 4.20–5.82)
RDW: 13.1 % (ref 11.0–14.6)
WBC: 5.3 10*3/uL (ref 4.0–10.3)
lymph#: 1.5 10*3/uL (ref 0.9–3.3)

## 2016-08-26 LAB — LACTATE DEHYDROGENASE: LDH: 193 U/L (ref 125–245)

## 2016-08-26 LAB — COMPREHENSIVE METABOLIC PANEL
ALBUMIN: 3.9 g/dL (ref 3.5–5.0)
ALK PHOS: 70 U/L (ref 40–150)
ALT: 13 U/L (ref 0–55)
AST: 18 U/L (ref 5–34)
Anion Gap: 9 mEq/L (ref 3–11)
BUN: 14.2 mg/dL (ref 7.0–26.0)
CALCIUM: 9.4 mg/dL (ref 8.4–10.4)
CHLORIDE: 110 meq/L — AB (ref 98–109)
CO2: 23 mEq/L (ref 22–29)
Creatinine: 1.3 mg/dL (ref 0.7–1.3)
EGFR: 63 mL/min/{1.73_m2} — AB (ref >90)
GLUCOSE: 95 mg/dL (ref 70–140)
POTASSIUM: 4.4 meq/L (ref 3.5–5.1)
SODIUM: 142 meq/L (ref 136–145)
Total Bilirubin: 0.71 mg/dL (ref 0.20–1.20)
Total Protein: 7.1 g/dL (ref 6.4–8.3)

## 2016-08-26 NOTE — Telephone Encounter (Signed)
Gave patient avs report and appointments for April. Central radiology will call re scan.  °

## 2016-08-26 NOTE — Progress Notes (Signed)
Hematology and Oncology Follow Up Visit  Roberto Ruiz [id number] 10/30/1966 50 y.o. 08/26/2016 8:20 AM Roberto Ruiz MDBouska, David, MD   Principle Diagnosis: 50 year old gentleman with stage IB nonseminomatous testis cancer presented with a T2 N0 mass in his left testicle. The pathology revealed mixed histology with 60% seminoma, 40% embryonal. Lymphovascular invasion was identified.   Prior Therapy:  He is status post left orchiectomy done on 11/13/2013. He is S/P adjuvant chemotherapy in the form of 2 cycles of BEP completed 01/30/2014.  Current therapy: Observation and surveillance.  Interim History: Roberto Ruiz presents today for a followup visit. Since the last visit, he reports no changes in his health. He has continued all work-related duties without any decline. He denied any lymphadenopathy in the neck or the groin area. He is reporting a small skin nodule around the left midaxillary which has resolved at this time. He is not report any major changes in his performance status, activity level or appetite. He does not report any changes in his urinary habits or discharge.  He does not report any headaches, blurry vision, syncope or seizures. He did not report any fevers or chills or sweats. He does not report any chest pain, palpitation orthopnea or leg edema. He reports no hematochezia or melena. Does not report any arthralgias or myalgias. He does not report any lymphadenopathy or petechiae. Rest of his review of system is unremarkable.  Medications: I have reviewed the patient's current medications. He takes none at this time.    Allergies:  Allergies  Allergen Reactions  . Compazine [Prochlorperazine Edisylate] Other (See Comments)    Caused severe shaking and leg cramps  . Sulfa Antibiotics Hives    Past Medical History, Surgical history, Social history, and Family History were reviewed and updated.  Physical Exam: Blood pressure 111/75, pulse 62, temperature 98.2 F  (36.8 C), temperature source Oral, resp. rate 18, height 5\' 8"  (1.727 m), weight 225 lb 9.6 oz (102.3 kg), SpO2 100 %. ECOG: 0 General appearance: Alert, awake gentleman without distress. Head: Normocephalic, no masses or lesions. Sclerae anicteric. Oral mucosae showed no ulcers or lesions. Neck: no adenopathy Lymph nodes: Cervical, supraclavicular, and axillary nodes normal. Heart:regular rate and rhythm, S1, S2. No murmurs rubs or gallops. Lung:chest clear, no wheezing, rales. No dullness to percussion. Abdomin: soft, non-tender, without masses or organomegaly no rebound or guarding. EXT:no erythema, induration, or nodules no edema. Neurological: No deficits noted. Skin examination: No rashes or lesions.   Lab Results: Lab Results  Component Value Date   WBC 5.3 08/26/2016   HGB 14.0 08/26/2016   HCT 41.8 08/26/2016   MCV 86.2 08/26/2016   PLT 161 08/26/2016     Chemistry      Component Value Date/Time   NA 141 02/19/2016 0805   K 4.3 02/19/2016 0805   CL 103 06/28/2014 1235   CO2 23 02/19/2016 0805   BUN 11.6 02/19/2016 0805   CREATININE 1.4 (H) 02/19/2016 0805      Component Value Date/Time   CALCIUM 9.6 02/19/2016 0805   ALKPHOS 63 02/19/2016 0805   AST 17 02/19/2016 0805   ALT 16 02/19/2016 0805   BILITOT 1.02 02/19/2016 0805     EXAM: CT CHEST, ABDOMEN, AND PELVIS WITH CONTRAST  TECHNIQUE: Multidetector CT imaging of the chest, abdomen and pelvis was performed following the standard protocol during bolus administration of intravenous contrast.  CONTRAST:  147mL ISOVUE-300 IOPAMIDOL (ISOVUE-300) INJECTION 61%  COMPARISON:  06/26/2015.  06/20/2014.  FINDINGS: CT CHEST  FINDINGS  Mediastinum/Lymph Nodes: There is no axillary lymphadenopathy. No mediastinal lymphadenopathy. There is no hilar lymphadenopathy. The heart size is normal. No pericardial effusion. The esophagus has normal imaging features.  Lungs/Pleura: No suspicious pulmonary nodule  or mass. But no focal airspace consolidation. No pulmonary edema or pleural effusion. Subsegmental atelectasis is noted in the lower lobes bilaterally, left greater than right. 4 mm ill-defined nodular focus in the posterior left lower lobe (image 109 series 4) is most likely atelectatic.  Musculoskeletal: Bone windows reveal no worrisome lytic or sclerotic osseous lesions. Evaluation of the left lateral chest wall in the mid axillary line reveals no lymphadenopathy or new subcutaneous mass lesion.  CT ABDOMEN PELVIS FINDINGS  Hepatobiliary: No focal abnormality within the liver parenchyma. There is no evidence for gallstones, gallbladder wall thickening, or pericholecystic fluid. No intrahepatic or extrahepatic biliary dilation.  Pancreas: No focal mass lesion. No dilatation of the main duct. No intraparenchymal cyst. No peripancreatic edema.  Spleen: No splenomegaly. No focal mass lesion.  Adrenals/Urinary Tract: No adrenal nodule or mass. No evidence for enhancing lesion in either kidney. There is no hydronephrosis. No evidence for hydroureter. The urinary bladder appears normal for the degree of distention.  Stomach/Bowel: Stomach is nondistended. No gastric wall thickening. No evidence of outlet obstruction. Duodenum is normally positioned as is the ligament of Treitz. No small bowel wall thickening. No small bowel dilatation. The terminal ileum is normal. The appendix is normal. Diverticular changes are noted in the left colon without evidence of diverticulitis.  Vascular/Lymphatic: No abdominal aortic aneurysm. No abdominal aortic atherosclerotic calcification. There is no gastrohepatic or hepatoduodenal ligament lymphadenopathy. No intraperitoneal or retroperitoneal lymphadenopathy. No pelvic sidewall lymphadenopathy.  Reproductive: The prostate gland and seminal vesicles have normal imaging features.  Other: No intraperitoneal free  fluid.  Musculoskeletal: Bone windows reveal no worrisome lytic or sclerotic osseous lesions.  IMPRESSION: 1. Stable exam. No new or progressive findings to suggest metastatic disease in the chest, abdomen, or pelvis. 2. Dependent atelectasis in the lung bases with a 4 mm nodular area in the posterior left lower lobe, most likely related to the atelectasis, but attention on follow-up recommended. 3. Physical exam revealed a small subcutaneous nodule in the left chest wall, along the mid axillary line. No underlying lymphadenopathy or soft tissue mass is evident by CT.    Impression and Plan:  50 year old gentleman with the following issues:  1. Nonseminomatous testicular cancer presented with a T2 N0 stage IB disease. He has a 40% embryonal component was lymphovascular invasion. He is S/P adjuvant chemotherapy in the form of 2 cycles of BEP.   CT scan on 02/24/2016 was reviewed again today and showed no abnormalities.  The plan is to continue active surveillance and stretch his visits to every 6 months with physical examination and laboratory testing. CT scan will be repeated every 12 months. He will be due for a scan in April 2018. I anticipate no further imaging studies will be needed after April 2019.   2. IV access:  Port-A-Cath was removed without any complications.  3. Small pulmonary nodule: Benign etiology based on his recent scan in April 2017.  4. Followup: Will be in 6 months.     [id number], MD 10/18/20178:20 AM

## 2016-08-27 LAB — BETA HCG QUANT (REF LAB)

## 2016-08-27 LAB — AFP TUMOR MARKER: AFP, Serum, Tumor Marker: 3.7 ng/mL (ref 0.0–8.3)

## 2016-08-29 LAB — BETA HCG, QN (PARALLEL TESTING): Beta hCG, Tumor Marker: <2 m[IU]/mL (ref <5.0)

## 2016-08-29 LAB — ALPHA FETO PROTEIN (PARALLEL TESTING): AFP-Tumor Marker: 3.8 ng/mL (ref <6.1)

## 2016-09-29 ENCOUNTER — Encounter: Payer: Self-pay | Admitting: *Deleted

## 2016-09-29 ENCOUNTER — Ambulatory Visit (INDEPENDENT_AMBULATORY_CARE_PROVIDER_SITE_OTHER): Payer: Managed Care, Other (non HMO)

## 2016-09-29 ENCOUNTER — Telehealth: Payer: Self-pay | Admitting: *Deleted

## 2016-09-29 ENCOUNTER — Ambulatory Visit (INDEPENDENT_AMBULATORY_CARE_PROVIDER_SITE_OTHER): Payer: Managed Care, Other (non HMO) | Admitting: Podiatry

## 2016-09-29 ENCOUNTER — Encounter: Payer: Self-pay | Admitting: Podiatry

## 2016-09-29 DIAGNOSIS — M7671 Peroneal tendinitis, right leg: Secondary | ICD-10-CM

## 2016-09-29 NOTE — Progress Notes (Signed)
He presents today with his wife for follow-up of pain to the lateral aspect of the right foot. He states that the pain is the bother him for the past 2-3 years and has progressively gotten worse. I saw him last October and injected his os perineum and the tendon by. He states that that worked for several months and is progressively just worsened since that time. He states that now it hurts with every step he takes. Denies any additional trauma.  Objective: I have reviewed his past medical history medications allergies surgeries and social history. Pulses are strongly palpable. Neurologic sensorium is intact. Deep tendon reflexes are intact. Muscle strength was 5 over 5 dorsiflexion plantar flexion inversion everters and all intrinsic musculature appears to be intact. He does have pain on abduction and plantar flexion and plantar flexion with eversion. Radiographs taken today do demonstrate an os perineum which appears to have progressed slightly proximally since the last saw him. There is swelling in the soft tissue surrounding the peroneal longus tendon. No open lesions or wounds are noted.  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  Plan: Requested an MRI of the right foot since conservative therapies have failed to render him asymptomatic.

## 2016-09-29 NOTE — Telephone Encounter (Signed)
MRI orders faxed to [place].

## 2016-10-06 ENCOUNTER — Ambulatory Visit: Payer: Managed Care, Other (non HMO) | Admitting: Podiatry

## 2016-10-07 ENCOUNTER — Ambulatory Visit
Admission: RE | Admit: 2016-10-07 | Discharge: 2016-10-07 | Disposition: A | Discharge status: Home / Self Care | Payer: Managed Care, Other (non HMO) | Source: Ambulatory Visit | Attending: Podiatry | Admitting: Podiatry

## 2016-10-07 DIAGNOSIS — M7671 Peroneal tendinitis, right leg: Secondary | ICD-10-CM

## 2016-10-10 ENCOUNTER — Other Ambulatory Visit: Payer: Managed Care, Other (non HMO)

## 2016-10-13 ENCOUNTER — Ambulatory Visit (INDEPENDENT_AMBULATORY_CARE_PROVIDER_SITE_OTHER): Payer: Managed Care, Other (non HMO) | Admitting: Podiatry

## 2016-10-13 ENCOUNTER — Encounter: Payer: Self-pay | Admitting: Podiatry

## 2016-10-13 DIAGNOSIS — M7671 Peroneal tendinitis, right leg: Secondary | ICD-10-CM

## 2016-10-14 ENCOUNTER — Telehealth: Payer: Self-pay | Admitting: *Deleted

## 2016-10-14 NOTE — Progress Notes (Signed)
He presents today for discussion of his MRI. He states that his right ankle has stll been very soreand he's had to be on it a lot.   Objective: I have reviewed his past medical history medications allergies surgeries and social history. Pulses are palpable. Neurologic sensorium is intact. He still has pain on plantar flexion and abductionat the level of the arcuate portion of the cuboid.  MRI suggests a peroneal longus tear right foot with an os perineum present.  Assessment: Tear of the peroneal tendon right ankle.  Plan: Discussed etiology pathology  Conservative versus surgical therapies. We consented him today for a primary repair of the peroneal longus tendon. We discussed the possible complications We discussed possible postop complications which may include but are not limited to postop pain bleeding swelling infection recurrence need for further surgery foot deformity loss of digif limb loss of life Development of a DVT or pulmonary embolism. He understands that he will be casted after surgery and room will require crutches for ambulation.we did discuss anesthesia and [facility] surgical center. He understands this and is amenable to it as well.

## 2016-10-14 NOTE — Telephone Encounter (Signed)
"  I am calling to let you know I am good to have surgery on 11/05/2016."  Okay, I will get it scheduled.

## 2016-11-05 ENCOUNTER — Other Ambulatory Visit: Payer: Self-pay | Admitting: Podiatry

## 2016-11-05 ENCOUNTER — Encounter: Payer: Self-pay | Admitting: Podiatry

## 2016-11-05 DIAGNOSIS — M7671 Peroneal tendinitis, right leg: Secondary | ICD-10-CM

## 2016-11-05 MED ORDER — HYDROMORPHONE HCL 4 MG PO TABS: 4.0000 mg | ORAL_TABLET | ORAL | 0 refills | Status: DC | PRN

## 2016-11-05 MED ORDER — ONDANSETRON HCL 4 MG PO TABS: 4.0000 mg | ORAL_TABLET | Freq: Three times a day (TID) | ORAL | 0 refills | Status: DC | PRN

## 2016-11-05 MED ORDER — CEPHALEXIN 500 MG PO CAPS: 500.0000 mg | ORAL_CAPSULE | Freq: Three times a day (TID) | ORAL | 0 refills | Status: DC

## 2016-11-10 ENCOUNTER — Telehealth: Payer: Self-pay

## 2016-11-10 NOTE — Telephone Encounter (Signed)
Spoke with pt regarding post op status, he states that he has had minimal pain, has only needed one pain pill. He states he is working but he is taking breaks and elevating his foot. Advised to continue with rest, ice, elevation and boot usage. Any acute symptom changes are to be reported

## 2016-11-12 ENCOUNTER — Ambulatory Visit (INDEPENDENT_AMBULATORY_CARE_PROVIDER_SITE_OTHER): Payer: Self-pay | Admitting: Podiatry

## 2016-11-12 ENCOUNTER — Encounter: Payer: Self-pay | Admitting: Podiatry

## 2016-11-12 VITALS — BP 115/73 | HR 74 | Temp 97.7°F | Resp 16

## 2016-11-12 DIAGNOSIS — Z9889 Other specified postprocedural states: Secondary | ICD-10-CM

## 2016-11-12 DIAGNOSIS — M7671 Peroneal tendinitis, right leg: Secondary | ICD-10-CM

## 2016-11-12 NOTE — Progress Notes (Signed)
He presents today 1 week status post peroneal tendon repair right ankle. He states that it hurts some when the block wore off but since that time it has not been doing bad at all. He denies chest pain shortness of breath or calf pain. He denies fever chills nausea vomiting.  Objective: Vital signs are stable he is alert and oriented 3 presents with crutches nonweightbearing. Plantar aspect of the cast appears to be intact and clean. He has good range of motion of his toes and some tenderness on attempted range of motion in the cast. Otherwise he has normal sensation to his toes. No signs of infection there were no need for radiographs today.  Assessment: Well-healing surgical foot right.  Plan: Continue nonweightbearing status. Continue to keep the leg elevated as much as possible follow-up with me in 1 week for cast change. Notify us with portions or concerns.

## 2016-11-19 ENCOUNTER — Ambulatory Visit (INDEPENDENT_AMBULATORY_CARE_PROVIDER_SITE_OTHER): Payer: Managed Care, Other (non HMO) | Admitting: Podiatry

## 2016-11-19 ENCOUNTER — Encounter: Payer: Self-pay | Admitting: Podiatry

## 2016-11-19 VITALS — Temp 97.9°F

## 2016-11-19 DIAGNOSIS — M7671 Peroneal tendinitis, right leg: Secondary | ICD-10-CM

## 2016-11-19 DIAGNOSIS — Z9889 Other specified postprocedural states: Secondary | ICD-10-CM

## 2016-11-19 NOTE — Progress Notes (Signed)
Roberto Ruiz presents today for his first cast change date of surgery 11/05/2016 repair right peroneal tendon. He denies fever chills nausea vomiting muscle aches and pains states that he's been going to work since the second week.  Objective: Vital signs are stable alert and oriented 3. Pulses are palpable. Casted and removed demonstrates minimal edema and no erythema cellulitis drainage or odor every other staple was removed to the right ankle today. Margins remain well coapted he has good range of motion and without a lot of soreness.  Assessment: Well-healing surgical foot.  Plan: [place] today dresser compressive dressing placed him in a below-knee fiberglass cast continue nonweightbearing status. Follow-up with me in 2 weeks for cast change once again.

## 2016-12-03 ENCOUNTER — Ambulatory Visit (INDEPENDENT_AMBULATORY_CARE_PROVIDER_SITE_OTHER): Payer: Managed Care, Other (non HMO) | Admitting: Podiatry

## 2016-12-03 DIAGNOSIS — M7671 Peroneal tendinitis, right leg: Secondary | ICD-10-CM

## 2016-12-03 DIAGNOSIS — Z9889 Other specified postprocedural states: Secondary | ICD-10-CM

## 2016-12-03 NOTE — Progress Notes (Signed)
He presents today 4 weeks status post peroneal tendon repair right foot. He denies fever chills nausea vomiting muscle aches and pains. He states that he is doing quite well. Denies chest pain shortness of breath or calf pain. Presents today nonweightbearing crutches and a cast.  Objective: Vital signs stable alert and oriented 3. The cast was clean on the bottom once removed demonstrates mild edema no erythema cellulitis drainage or odor. Remainder of the sutures were removed margins remain well coapted he has good range of motion of foot dorsiflexion plantar flexion inversion and eversion I did not try any resistance. He states that he has more pain in the anterior ankle and the medial foot than he does in the lateral aspect of the surgery was performed. I see no signs of infection.  Assessment: Well-healing surgical foot and ankle.  Plan: I placed him in a Cam Walker continue nonweightbearing status I did encourage him to occasionally remittance of a Cam Walker and exercises at with dorsiflexion plantar flexion inversion and eversion and also to get his toes moving. I will follow-up with him in 2 weeks at which time we hope to start partial weightbearing. The

## 2016-12-17 ENCOUNTER — Ambulatory Visit (INDEPENDENT_AMBULATORY_CARE_PROVIDER_SITE_OTHER): Payer: Self-pay | Admitting: Podiatry

## 2016-12-17 DIAGNOSIS — Z9889 Other specified postprocedural states: Secondary | ICD-10-CM

## 2016-12-17 DIAGNOSIS — M7671 Peroneal tendinitis, right leg: Secondary | ICD-10-CM | POA: Diagnosis not present

## 2016-12-17 NOTE — Progress Notes (Signed)
DOS 12.28.2017 Primary repair peroneus longus right. Cast application right foot/leg.

## 2016-12-20 NOTE — Progress Notes (Signed)
He presents today for postop visit date of surgery 11/05/2016 for repair of peroneal ankle right foot area states there is a little sore still.  Objective: He presents today in his Cam Walker nonweightbearing status using scooter. Minimal edema no erythema no cellulitis drainage or odor good function of the foot against resistance. Minimal tenderness on palpation of that right ankle.  Assessment: Well-healing surgical foot right.  Plan: We will allow him to start partial weightbearing for the next week or so utilizing crutches and a cam walker he also placed him in a Tri-Lock brace in hopes that he may be able to utilize that in the next 2-3 weeks.

## 2017-01-07 ENCOUNTER — Ambulatory Visit (INDEPENDENT_AMBULATORY_CARE_PROVIDER_SITE_OTHER): Payer: Self-pay | Admitting: Podiatry

## 2017-01-07 DIAGNOSIS — M7671 Peroneal tendinitis, right leg: Secondary | ICD-10-CM

## 2017-01-07 DIAGNOSIS — Z9889 Other specified postprocedural states: Secondary | ICD-10-CM

## 2017-01-09 NOTE — Progress Notes (Signed)
He presents today for follow-up of his tendon repair right foot. He states that is doing okay but it hurts across the top when I wear the brace. Date of surgery 11/05/2016. He's been wearing regular shoes for the past 5 days and states that he is doing very well with them.  Objective: Vital signs are stable he is alert and oriented 3. Peroneal tendon right foot is strong muscle strength +5 out of 5 plantar flexion and eversion and abduction. No open lesions or wounds. Minimal edema.  Assessment: 1 surgical foot.  Plan: Follow up with me in 1 month.

## 2017-01-28 ENCOUNTER — Encounter: Payer: Self-pay | Admitting: Podiatrist

## 2017-01-28 ENCOUNTER — Ambulatory Visit (INDEPENDENT_AMBULATORY_CARE_PROVIDER_SITE_OTHER): Payer: Managed Care, Other (non HMO)

## 2017-01-28 ENCOUNTER — Ambulatory Visit (INDEPENDENT_AMBULATORY_CARE_PROVIDER_SITE_OTHER): Payer: Managed Care, Other (non HMO) | Admitting: Podiatrist

## 2017-01-28 DIAGNOSIS — S99921A Unspecified injury of right foot, initial encounter: Secondary | ICD-10-CM

## 2017-01-28 DIAGNOSIS — Z9889 Other specified postprocedural states: Secondary | ICD-10-CM

## 2017-01-28 MED ORDER — MELOXICAM 15 MG PO TABS: 15.0000 mg | ORAL_TABLET | Freq: Every day | ORAL | 2 refills | Status: DC

## 2017-01-28 NOTE — Progress Notes (Signed)
Patient presents today for lateral foot pain over the surgical site where Dr. Milinda Pointer repaired his tendon on 11/05/2016.  He relates the wind blew his car door closed on his surgical foot and it has been swollen and painful ever since.   O:  Neurovascular status intact.  Swelling present lateral side left foot over the mid portion of the incision site.  Incision is healed fully.  Warmth is also present at this area of the incision site as well.  No palpable delve noted along the peroneal tendon.  xrays are negative for fracture.  A:  Injury to the right surgical foot  P:  Recommended he go back into his surgical boot until he see's Dr. Milinda Pointer.  Rx for meloxicam called in to his pharmacy and a compression anklet is dispensed for the swelling.  He will call with questions or concerns .

## 2017-02-22 ENCOUNTER — Telehealth: Payer: Self-pay | Admitting: Oncology

## 2017-02-22 ENCOUNTER — Telehealth: Payer: Self-pay | Admitting: *Deleted

## 2017-02-22 NOTE — Telephone Encounter (Signed)
Received call from [place] at [facility]. She stated,"patient wants to go to [facility] to get CT scans done. Please fax the order to [phone]." Orders faxed.

## 2017-02-22 NOTE — Telephone Encounter (Signed)
sw pt to confirm r/s appt to 2/24 at 945 am due to MD 4/20 CME

## 2017-02-23 ENCOUNTER — Ambulatory Visit (INDEPENDENT_AMBULATORY_CARE_PROVIDER_SITE_OTHER): Payer: Managed Care, Other (non HMO) | Admitting: Podiatry

## 2017-02-23 ENCOUNTER — Encounter: Payer: Self-pay | Admitting: Podiatry

## 2017-02-23 DIAGNOSIS — M7671 Peroneal tendinitis, right leg: Secondary | ICD-10-CM

## 2017-02-23 DIAGNOSIS — Z9889 Other specified postprocedural states: Secondary | ICD-10-CM

## 2017-02-23 NOTE — Progress Notes (Signed)
He presents today for follow-up of his peroneal tendinitis and tendon repair. He states is doing much better still gets a little stinging pain near the incision and to the proximal portion of the incision most aspect of the incision. There is getting better.  Objective: No edema no erythema pulses are palpable. He has some tenderness on palpation of the proximal portion of the scar. This is still bounced tight to deep tissues the rest of the scar is very loose.  Assessment: Well-healing surgical foot right.  Plan: Encouraged him to continue heel to toe gait and also encouraged compression anklet. I will follow-up with him in 2 weeks if necessary. I also encouraged physical therapy for breakdown of scar.

## 2017-02-24 ENCOUNTER — Other Ambulatory Visit (HOSPITAL_BASED_OUTPATIENT_CLINIC_OR_DEPARTMENT_OTHER): Payer: Managed Care, Other (non HMO)

## 2017-02-24 ENCOUNTER — Ambulatory Visit (HOSPITAL_COMMUNITY)
Admission: RE | Admit: 2017-02-24 | Discharge: 2017-02-24 | Disposition: A | Discharge status: Home / Self Care | Payer: Managed Care, Other (non HMO) | Source: Ambulatory Visit | Attending: Oncology | Admitting: Oncology

## 2017-02-24 ENCOUNTER — Encounter (HOSPITAL_COMMUNITY): Payer: Self-pay

## 2017-02-24 DIAGNOSIS — Z08 Encounter for follow-up examination after completed treatment for malignant neoplasm: Secondary | ICD-10-CM | POA: Diagnosis not present

## 2017-02-24 DIAGNOSIS — Z9079 Acquired absence of other genital organ(s): Secondary | ICD-10-CM | POA: Insufficient documentation

## 2017-02-24 DIAGNOSIS — M5136 Other intervertebral disc degeneration, lumbar region: Secondary | ICD-10-CM | POA: Insufficient documentation

## 2017-02-24 DIAGNOSIS — K5730 Diverticulosis of large intestine without perforation or abscess without bleeding: Secondary | ICD-10-CM | POA: Insufficient documentation

## 2017-02-24 DIAGNOSIS — Z8547 Personal history of malignant neoplasm of testis: Secondary | ICD-10-CM | POA: Insufficient documentation

## 2017-02-24 DIAGNOSIS — C6292 Malignant neoplasm of left testis, unspecified whether descended or undescended: Secondary | ICD-10-CM | POA: Diagnosis not present

## 2017-02-24 LAB — COMPREHENSIVE METABOLIC PANEL
ALK PHOS: 77 U/L (ref 40–150)
ALT: 24 U/L (ref 0–55)
AST: 34 U/L (ref 5–34)
Albumin: 4.6 g/dL (ref 3.5–5.0)
Anion Gap: 14 mEq/L — ABNORMAL HIGH (ref 3–11)
BILIRUBIN TOTAL: 0.91 mg/dL (ref 0.20–1.20)
BUN: 18.2 mg/dL (ref 7.0–26.0)
CO2: 20 meq/L — AB (ref 22–29)
Calcium: 10 mg/dL (ref 8.4–10.4)
Chloride: 107 mEq/L (ref 98–109)
Creatinine: 1.4 mg/dL — ABNORMAL HIGH (ref 0.7–1.3)
EGFR: 61 mL/min/{1.73_m2} — ABNORMAL LOW (ref >90)
GLUCOSE: 84 mg/dL (ref 70–140)
Potassium: 4.2 mEq/L (ref 3.5–5.1)
SODIUM: 141 meq/L (ref 136–145)
TOTAL PROTEIN: 7.9 g/dL (ref 6.4–8.3)

## 2017-02-24 LAB — CBC WITH DIFFERENTIAL/PLATELET
BASO%: 0.4 % (ref 0.0–2.0)
Basophils Absolute: 0 10*3/uL (ref 0.0–0.1)
EOS%: 1.2 % (ref 0.0–7.0)
Eosinophils Absolute: 0.1 10*3/uL (ref 0.0–0.5)
HCT: 43.9 % (ref 38.4–49.9)
HGB: 14.6 g/dL (ref 13.0–17.1)
LYMPH%: 27.2 % (ref 14.0–49.0)
MCH: 28.5 pg (ref 27.2–33.4)
MCHC: 33.3 g/dL (ref 32.0–36.0)
MCV: 85.6 fL (ref 79.3–98.0)
MONO#: 0.4 10*3/uL (ref 0.1–0.9)
MONO%: 7.2 % (ref 0.0–14.0)
NEUT%: 64 % (ref 39.0–75.0)
NEUTROS ABS: 3.2 10*3/uL (ref 1.5–6.5)
Platelets: 160 10*3/uL (ref 140–400)
RBC: 5.13 10*6/uL (ref 4.20–5.82)
RDW: 13.6 % (ref 11.0–14.6)
WBC: 5 10*3/uL (ref 4.0–10.3)
lymph#: 1.4 10*3/uL (ref 0.9–3.3)

## 2017-02-24 LAB — LACTATE DEHYDROGENASE: LDH: 210 U/L (ref 125–245)

## 2017-02-24 MED ORDER — IOPAMIDOL (ISOVUE-300) INJECTION 61%
100.0000 mL | Freq: Once | INTRAVENOUS | Status: AC | PRN
  Administered 2017-02-24: 100 mL via INTRAVENOUS

## 2017-02-24 MED ORDER — IOPAMIDOL (ISOVUE-300) INJECTION 61%
INTRAVENOUS | Status: AC
  Filled 2017-02-24: qty 100

## 2017-02-25 ENCOUNTER — Ambulatory Visit: Payer: Managed Care, Other (non HMO) | Admitting: Oncology

## 2017-02-25 LAB — AFP TUMOR MARKER: AFP, SERUM, TUMOR MARKER: 4.3 ng/mL (ref 0.0–8.3)

## 2017-02-25 LAB — BETA HCG QUANT (REF LAB): hCG Quant: <1 m[IU]/mL (ref 0–3)

## 2017-02-26 ENCOUNTER — Ambulatory Visit: Payer: Managed Care, Other (non HMO) | Admitting: Oncology

## 2017-03-02 ENCOUNTER — Ambulatory Visit (HOSPITAL_BASED_OUTPATIENT_CLINIC_OR_DEPARTMENT_OTHER): Payer: Managed Care, Other (non HMO) | Admitting: Oncology

## 2017-03-02 ENCOUNTER — Telehealth: Payer: Self-pay | Admitting: Oncology

## 2017-03-02 VITALS — BP 120/77 | HR 59 | Temp 97.6°F | Resp 16 | Wt 228.2 lb

## 2017-03-02 DIAGNOSIS — C6292 Malignant neoplasm of left testis, unspecified whether descended or undescended: Secondary | ICD-10-CM | POA: Diagnosis not present

## 2017-03-02 DIAGNOSIS — R918 Other nonspecific abnormal finding of lung field: Secondary | ICD-10-CM

## 2017-03-02 DIAGNOSIS — Z8547 Personal history of malignant neoplasm of testis: Secondary | ICD-10-CM

## 2017-03-02 NOTE — Telephone Encounter (Signed)
Gave patient AVS and calender per 4/24 los.  

## 2017-03-02 NOTE — Progress Notes (Signed)
Hematology and Oncology Follow Up Visit  Roberto Ruiz [id number] [birth year] 51 y.o. 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 The pathology revealed mixed histology with 60% seminoma, 40% embryonal. Lymphovascular invasion was identified.   Prior Therapy:  He is status post left orchiectomy done on 11/13/2013. He is S/P adjuvant chemotherapy in the form of 2 cycles of BEP completed 01/30/2014.  Current therapy: Observation and surveillance.  Interim History: Roberto Ruiz presents today for a followup visit. Since the last visit, he continues to do very well. He reports no complaints or illnesses.  He denied any lymphadenopathy in the neck or the groin area. He does not report any changes in his urinary habits or discharge. He denied any cough or dyspnea on exertion. He denied any neck masses or dysphagia. He continues to work full time without any decline in ability to do so.  He does not report any headaches, blurry vision, syncope or seizures. He did not report any fevers or chills or sweats. He does not report any chest pain, palpitation orthopnea or leg edema. He reports no hematochezia or melena. Does not report any arthralgias or myalgias. He does not report any lymphadenopathy or petechiae. Rest of his review of system is unremarkable.  Medications: I have reviewed the patient's current medications. He takes none at this time.    Allergies:  Allergies  Allergen Reactions  . Compazine [Prochlorperazine Edisylate] Other (See Comments)    Caused severe shaking and leg cramps  . Sulfa Antibiotics Hives    Past Medical History, Surgical history, Social history, and Family History were reviewed and updated.  Physical Exam: Vital signs reviewed today and continue to be within normal range. ECOG: 0 General appearance: Well-appearing  gentleman appeared without distress. Head: Normocephalic, no masses or lesions. Sclerae anicteric. Oral mucosae showed no ulcers or lesions. No thrush noted. Neck: no adenopathy Lymph nodes: Cervical, supraclavicular, and axillary nodes normal. Heart:regular rate and rhythm, S1, S2. No murmurs rubs or gallops. Lung:chest clear, no wheezing, rales. No dullness to percussion. Abdomin: soft, non-tender, without masses or organomegaly no shifting dullness or ascites. EXT:no erythema, induration, or nodules no edema. Neurological: No deficits noted. Skin examination: No rashes or lesions.   Lab Results: Lab Results  Component Value Date   WBC 5.0 02/24/2017   HGB 14.6 02/24/2017   HCT 43.9 02/24/2017   MCV 85.6 02/24/2017   PLT 160 02/24/2017     Chemistry      Component Value Date/Time   NA 141 02/24/2017 0746   K 4.2 02/24/2017 0746   CL 103 06/28/2014 1235   CO2 20 (L) 02/24/2017 0746   BUN 18.2 02/24/2017 0746   CREATININE 1.4 (H) 02/24/2017 0746      Component Value Date/Time   CALCIUM 10.0 02/24/2017 0746   ALKPHOS 77 02/24/2017 0746   AST 34 02/24/2017 0746   ALT 24 02/24/2017 0746   BILITOT 0.91 02/24/2017 0746     Results for [name], [name] (MRN [id number]) as of 03/02/2017 07:48  Ref. Range 02/24/2017 07:46  AFP, Serum, Tumor Marker Latest Ref Range: 0.0 - 8.3 ng/mL 4.3   EXAM: CT CHEST, ABDOMEN, AND PELVIS WITH CONTRAST  TECHNIQUE: Multidetector CT imaging of the chest, abdomen and pelvis was performed following the standard protocol during bolus administration of intravenous contrast.  CONTRAST:  111mL ISOVUE-300 IOPAMIDOL (ISOVUE-300) INJECTION 61%  COMPARISON:  02/24/2016.  FINDINGS: CT CHEST FINDINGS  Cardiovascular: Unremarkable  Mediastinum/Nodes: Unremarkable  Lungs/Pleura: Stable tiny subpleural lymph node along the minor fissure, image 77/4. The previous 2 by 4 mm posterior basal segment left lower lobe nodule is no longer  seen.  Musculoskeletal: Unremarkable  CT ABDOMEN PELVIS FINDINGS  Hepatobiliary: Unremarkable  Pancreas: Unremarkable  Spleen: Unremarkable  Adrenals/Urinary Tract: Unremarkable  Stomach/Bowel: Sigmoid colon diverticulosis.  Vascular/Lymphatic: No pathologic adenopathy or significant atherosclerotic vascular calcification.  Reproductive: Left orchectomy.  Other: No supplemental non-categorized findings.  Musculoskeletal: Transitional L5 vertebra. Lumbar spondylosis and degenerative disc disease cause central narrowing of the thecal sac at L3-4 and bilateral foraminal stenosis at L4-5.  IMPRESSION: 1. No findings of recurrent malignancy. 2. Transitional L5 vertebra, with impingement noted at L3-4 and L4-5. 3. Sigmoid colon diverticulosis.    Impression and Plan:  51 year old gentleman with the following issues:  1. Nonseminomatous testicular cancer presented with a T2 N0 stage IB disease. He has a 40% embryonal component was lymphovascular invasion. He is S/P adjuvant chemotherapy in the form of 2 cycles of BEP.   CT scan Obtained on 02/24/2017 was personally reviewed and showed no evidence of recurrent disease.  He will continue on active surveillance for the time being I will repeat a physical examination and laboratory testing in 6 months. CT scan will be done in April 2019 which will be his last imaging study at the time.   2. Age-appropriate cancer screening: He is up-to-date at this time.  3. Small pulmonary nodule: Benign etiology based on his recent scan in April 2018.   4. Followup: Will be in 6 months for laboratory testing and physical examination.    [facility], MD 4/24/20188:24 AM

## 2017-04-12 ENCOUNTER — Encounter (HOSPITAL_BASED_OUTPATIENT_CLINIC_OR_DEPARTMENT_OTHER): Payer: Self-pay | Admitting: Emergency Medicine

## 2017-04-12 ENCOUNTER — Emergency Department (HOSPITAL_BASED_OUTPATIENT_CLINIC_OR_DEPARTMENT_OTHER)
Admission: EM | Admit: 2017-04-12 | Discharge: 2017-04-13 | Disposition: A | Discharge status: Home / Self Care | Payer: Managed Care, Other (non HMO) | Attending: Emergency Medicine | Admitting: Emergency Medicine

## 2017-04-12 ENCOUNTER — Emergency Department (HOSPITAL_BASED_OUTPATIENT_CLINIC_OR_DEPARTMENT_OTHER): Payer: Managed Care, Other (non HMO)

## 2017-04-12 DIAGNOSIS — R0602 Shortness of breath: Secondary | ICD-10-CM | POA: Diagnosis present

## 2017-04-12 DIAGNOSIS — R061 Stridor: Secondary | ICD-10-CM

## 2017-04-12 NOTE — ED Triage Notes (Signed)
SOB x 1 month but got worse tonight

## 2017-04-13 ENCOUNTER — Emergency Department (HOSPITAL_BASED_OUTPATIENT_CLINIC_OR_DEPARTMENT_OTHER): Payer: Managed Care, Other (non HMO)

## 2017-04-13 MED ORDER — SODIUM CHLORIDE 0.9 % IN NEBU
INHALATION_SOLUTION | RESPIRATORY_TRACT | Status: AC
  Administered 2017-04-13: 01:00:00
  Filled 2017-04-13: qty 3

## 2017-04-13 MED ORDER — ONDANSETRON HCL 4 MG/2ML IJ SOLN
4.0000 mg | Freq: Once | INTRAMUSCULAR | Status: AC
  Administered 2017-04-13: 4 mg via INTRAVENOUS
  Filled 2017-04-13: qty 2

## 2017-04-13 MED ORDER — RACEPINEPHRINE HCL 2.25 % IN NEBU
0.5000 mL | INHALATION_SOLUTION | Freq: Once | RESPIRATORY_TRACT | Status: AC
  Administered 2017-04-13: 0.5 mL via RESPIRATORY_TRACT
  Filled 2017-04-13: qty 0.5

## 2017-04-13 MED ORDER — SODIUM CHLORIDE 0.9 % IN NEBU
INHALATION_SOLUTION | RESPIRATORY_TRACT | Status: AC
  Filled 2017-04-13: qty 3

## 2017-04-13 MED ORDER — DEXAMETHASONE SODIUM PHOSPHATE 10 MG/ML IJ SOLN
10.0000 mg | Freq: Once | INTRAMUSCULAR | Status: AC
  Administered 2017-04-13: 10 mg via INTRAVENOUS
  Filled 2017-04-13: qty 1

## 2017-04-13 MED ORDER — PANTOPRAZOLE SODIUM 40 MG IV SOLR
40.0000 mg | Freq: Once | INTRAVENOUS | Status: AC
  Administered 2017-04-13: 40 mg via INTRAVENOUS
  Filled 2017-04-13: qty 40

## 2017-04-13 MED ORDER — OMEPRAZOLE 20 MG PO CPDR: 20.0000 mg | DELAYED_RELEASE_CAPSULE | Freq: Every day | ORAL | 0 refills | Status: DC

## 2017-04-13 NOTE — ED Notes (Signed)
ED Provider at bedside. 

## 2017-04-13 NOTE — ED Provider Notes (Signed)
[facility] Provider Note: Georgena Spurling, MD, FACEP  CSN: [id number] MRN: [id number] ARRIVAL: 04/12/17 at 2137 ROOM: [place]  Roberto Ruiz is a 51 y.o. male with a one-month history of shortness of breath. The shortness of breath is worse at night when lying supine. He describes it as a tightness in his throat. He has no known history of GERD. He does have occasional epigastric burning. Symptoms are also worse with exertion.  He is here now with an episode that began about 9 PM yesterday evening after an argument with his son. He describes the symptoms as severe and he felt like he could barely move air. This episode lasted about an hour and a half. It was associated with paresthesias of the fingertips, sweating and anxiety. He denies associated chest pain. He denies current abdominal pain or diarrhea. He did have some transient nausea. Symptoms have improved but he still has some tightness in his throat and associated dyspnea. He is status post orchiectomy 3 years ago for testicular cancer. He states a CT scan follow-up in April of this year showed no residual disease.   Past Medical History:  Diagnosis Date  . Cancer ([place])    testicular  . GERD (gastroesophageal reflux disease)    with Chemo  . Wears contact lenses     Past Surgical History:  Procedure Laterality Date  . ORCHIECTOMY Left 11/13/2013   Procedure: LEFT RADICAL INGUINAL ORCHIECTOMY;  Surgeon: Molli Hazard, MD;  Location[facility];  Service: Urology;  Laterality: Left;  . right arthroscopy w/ open removal of reputured bursa  1987  . TONSILLECTOMY AND ADENOIDECTOMY  1990    History reviewed. No pertinent family history.  Social History  Substance Use Topics  . Smoking status: Never Smoker  . Smokeless tobacco: Never Used  . Alcohol use No    Prior to Admission medications   Medication Sig Start Date  End Date Taking? Authorizing Provider  meloxicam (MOBIC) 15 MG tablet Take 1 tablet (15 mg total) by mouth daily. Patient not taking: Reported on 03/02/2017 01/28/17   Bronson Ing, DPM    Allergies Compazine [prochlorperazine edisylate] and Sulfa antibiotics   REVIEW OF SYSTEMS  Negative except as noted here or in the History of Present Illness.   PHYSICAL EXAMINATION  Initial Vital Signs Blood pressure 108/69, pulse 70, temperature 97.8 F (36.6 C), temperature source Oral, resp. rate 20, height 5\' 9"  (1.753 m), weight 99.8 kg (220 lb), SpO2 99 %.  Examination General: Well-developed, well-nourished male in no acute distress; appearance consistent with age of record HENT: normocephalic; atraumatic; no pharyngeal erythema, edema or exudate; no dysphonia; stridor Eyes: pupils equal, round and reactive to light; extraocular muscles intact Neck: supple Heart: regular rate and rhythm Lungs: clear to auscultation bilaterally except for transmitted upper airway stridor Abdomen: soft; nondistended; nontender; no masses or hepatosplenomegaly; bowel sounds present Extremities: No deformity; full range of motion; pulses normal Neurologic: Awake, alert and oriented; motor function intact in all extremities and symmetric; no facial droop Skin: Warm and dry Psychiatric: Normal mood and affect   RESULTS  Summary of this visit's results, reviewed by myself:   EKG Interpretation  Date/Time:  Monday April 12 2017 21:45:45 EDT Ventricular Rate:  87 PR Interval:  206 QRS Duration: 80 QT Interval:  366 QTC Calculation: 440 R Axis:   23 Text Interpretation:  Normal sinus rhythm  no acute ST/T changes No old tracing to compare Confirmed by Sherwood Gambler [phone]) on 04/12/2017 10:02:00 PM      Laboratory Studies: No results found for this or any previous visit (from the past 24 hour(s)). Imaging Studies: Dg Neck Soft Tissue  Result Date: 04/13/2017 CLINICAL DATA:  Stridor. EXAM: NECK  SOFT TISSUES - 1+ VIEW COMPARISON:  None. FINDINGS: There is no evidence of retropharyngeal soft tissue swelling or epiglottic enlargement. The cervical airway is unremarkable. No radio-opaque foreign body identified. IMPRESSION: Negative radiographs of the soft tissues of the neck. Electronically Signed   By: Jeb Levering M.D.   On: 04/13/2017 01:47   Dg Chest 2 View  Result Date: 04/12/2017 CLINICAL DATA:  Shortness of breath for 1 month, worsened tonight. EXAM: CHEST  2 VIEW COMPARISON:  Chest CT 02/24/2017 FINDINGS: Low lung volumes. The cardiomediastinal contours are normal. Pulmonary vasculature is normal. No consolidation, pleural effusion, or pneumothorax. No acute osseous abnormalities are seen. IMPRESSION: Low lung volumes without acute abnormality. Electronically Signed   By: Jeb Levering M.D.   On: 04/12/2017 22:15    ED COURSE  Nursing notes and initial vitals signs, including pulse oximetry, reviewed.  Vitals:   04/12/17 2145 04/12/17 2147 04/12/17 2359 04/13/17 0139  BP: 114/84  108/69 116/79  Pulse: 84  70 69  Resp: 18  20 20   Temp: 97.8 F (36.6 C)     TempSrc: Oral     SpO2: 97%  99% 97%  Weight:  99.8 kg (220 lb)    Height:  5\' 9"  (1.753 m)     1:26 AM Stridor resolved after racemic epinephrine neb treatment. Patient also given Protonix as I suspect GERD may be exacerbating his symptoms. We will also give him dexamethasone.  2:01 AM Patient continues to be stridor free and feeling better. We will start him on a PPI. We will have him follow-up with his primary care physician for further evaluation and treatment.  PROCEDURES    ED DIAGNOSES     ICD-9-CM ICD-10-CM   1. Shortness of breath 786.05 R06.02   2. Stridor 786.1 R06.1        Shanay Woolman, Jenny Reichmann, MD 04/13/17 0202

## 2017-04-27 ENCOUNTER — Ambulatory Visit (INDEPENDENT_AMBULATORY_CARE_PROVIDER_SITE_OTHER): Payer: Managed Care, Other (non HMO) | Admitting: Podiatry

## 2017-04-27 ENCOUNTER — Encounter: Payer: Self-pay | Admitting: Podiatry

## 2017-04-27 DIAGNOSIS — M7671 Peroneal tendinitis, right leg: Secondary | ICD-10-CM | POA: Diagnosis not present

## 2017-04-27 NOTE — Progress Notes (Signed)
He presents today for follow-up visit status post peroneal tendinitis and repair the peroneal tendons right foot. States it is hurting more in the back of the leg and ankle and he has some stabbing pain just behind the lateral malleolus. He states that he is still not able to take a normal heel-to-toe gait and feels that his right leg is weaker than the left.  Objective: Vital signs are stable alert and oriented 3. Pulses are palpable. When I palpate the lateral aspect in the posterior lateral aspect of the I am unable to feel and not that he states that comes up by the end of the day. It is tender in this area and he does not demonstrate any type of radiating pain on palpation. He has full plantarflexion against resistance and abduction against resistance.  Assessment: Right leg weakness and pain secondary to previous surgery possibly.  Plan: I'm going to send him to physical therapy for strengthening exercises and gait training. I'll follow-up with him in about 2 months.

## 2017-05-03 ENCOUNTER — Telehealth: Payer: Self-pay | Admitting: Podiatry

## 2017-05-03 DIAGNOSIS — M7671 Peroneal tendinitis, right leg: Secondary | ICD-10-CM

## 2017-05-03 NOTE — Telephone Encounter (Signed)
Hi Valery, this is Elmyra Ricks with [facility]. Dr. Milinda Pointer ordered PT for patient and I was wondering if you could fax [place] the prescription to [phone]. Thank you.

## 2017-05-04 NOTE — Telephone Encounter (Signed)
Faxed orders to [facility] for evaluation and treatment of right peroneal tendonitis gait/balance and strengthening PT

## 2017-08-24 ENCOUNTER — Ambulatory Visit (HOSPITAL_BASED_OUTPATIENT_CLINIC_OR_DEPARTMENT_OTHER): Payer: Managed Care, Other (non HMO) | Admitting: Oncology

## 2017-08-24 ENCOUNTER — Other Ambulatory Visit (HOSPITAL_BASED_OUTPATIENT_CLINIC_OR_DEPARTMENT_OTHER): Payer: Managed Care, Other (non HMO)

## 2017-08-24 ENCOUNTER — Telehealth: Payer: Self-pay | Admitting: Oncology

## 2017-08-24 VITALS — BP 118/83 | HR 54 | Temp 98.7°F | Resp 20 | Ht 69.0 in | Wt 235.3 lb

## 2017-08-24 DIAGNOSIS — C6292 Malignant neoplasm of left testis, unspecified whether descended or undescended: Secondary | ICD-10-CM

## 2017-08-24 DIAGNOSIS — Z8547 Personal history of malignant neoplasm of testis: Secondary | ICD-10-CM | POA: Diagnosis not present

## 2017-08-24 LAB — CBC WITH DIFFERENTIAL/PLATELET
BASO%: 0.8 % (ref 0.0–2.0)
Basophils Absolute: 0 10*3/uL (ref 0.0–0.1)
EOS ABS: 0.1 10*3/uL (ref 0.0–0.5)
EOS%: 1.9 % (ref 0.0–7.0)
HCT: 40.7 % (ref 38.4–49.9)
HGB: 13.6 g/dL (ref 13.0–17.1)
LYMPH%: 25.3 % (ref 14.0–49.0)
MCH: 28.8 pg (ref 27.2–33.4)
MCHC: 33.4 g/dL (ref 32.0–36.0)
MCV: 86.1 fL (ref 79.3–98.0)
MONO#: 0.5 10*3/uL (ref 0.1–0.9)
MONO%: 8.4 % (ref 0.0–14.0)
NEUT%: 63.6 % (ref 39.0–75.0)
NEUTROS ABS: 3.5 10*3/uL (ref 1.5–6.5)
Platelets: 149 10*3/uL (ref 140–400)
RBC: 4.73 10*6/uL (ref 4.20–5.82)
RDW: 14.6 % (ref 11.0–14.6)
WBC: 5.5 10*3/uL (ref 4.0–10.3)
lymph#: 1.4 10*3/uL (ref 0.9–3.3)

## 2017-08-24 LAB — COMPREHENSIVE METABOLIC PANEL
ALT: 18 U/L (ref 0–55)
AST: 19 U/L (ref 5–34)
Albumin: 4.1 g/dL (ref 3.5–5.0)
Alkaline Phosphatase: 61 U/L (ref 40–150)
Anion Gap: 10 mEq/L (ref 3–11)
BUN: 21 mg/dL (ref 7.0–26.0)
CHLORIDE: 110 meq/L — AB (ref 98–109)
CO2: 21 meq/L — AB (ref 22–29)
Calcium: 9.4 mg/dL (ref 8.4–10.4)
Creatinine: 1.3 mg/dL (ref 0.7–1.3)
GLUCOSE: 90 mg/dL (ref 70–140)
POTASSIUM: 4.3 meq/L (ref 3.5–5.1)
SODIUM: 141 meq/L (ref 136–145)
Total Bilirubin: 0.63 mg/dL (ref 0.20–1.20)
Total Protein: 7.1 g/dL (ref 6.4–8.3)

## 2017-08-24 LAB — LACTATE DEHYDROGENASE: LDH: 176 U/L (ref 125–245)

## 2017-08-24 NOTE — Telephone Encounter (Signed)
Gave avs and calendar for April 2019 °

## 2017-08-24 NOTE — Progress Notes (Signed)
Hematology and Oncology Follow Up Visit  HASHIM EICHHORST [id number] 1966-04-28 51 y.o. 08/24/2017 8:40 AM [name], [name], MD   Principle Diagnosis: 62 year old gentleman with stage IB nonseminomatous testis cancer presented with a T2 N0 mass in his left testicle. The pathology revealed mixed histology with 60% seminoma, 40% embryonal. Lymphovascular invasion was identified.   Prior Therapy:  He is status post left orchiectomy done on 11/13/2013. He is S/P adjuvant chemotherapy in the form of 2 cycles of BEP completed 01/30/2014.  Current therapy: Observation and surveillance.  Interim History: Mr. Housand presents today for a followup visit. Since the last visit, he was seen in the emergency department in June 2018 for difficulty breathing. He appeared to have reactive airway disease and his episode has resolved at this time. He is no longer reporting any difficulty breathing or shortness of breath. He had a chest x-ray as well as x-ray of his neck showed no abnormalities.  He denied any lymphadenopathy in the neck or the groin area. He does not report any changes in his urinary habits or discharge. He denied any dysphagia. He continues to work full time I continues to have excellent appetite.  He does not report any headaches, blurry vision, syncope or seizures. He did not report any fevers or chills or sweats. He does not report any chest pain, palpitation orthopnea or leg edema. He reports no hematochezia or melena. Does not report any arthralgias or myalgias. He does not report any lymphadenopathy or petechiae. Rest of his review of system is unremarkable.  Medications: I have reviewed the patient's current medications. He takes none at this time.    Allergies:  Allergies  Allergen Reactions  . Compazine [Prochlorperazine Edisylate] Other (See Comments)    Caused severe shaking and leg cramps  . Sulfa Antibiotics Hives    Past Medical History, Surgical history, Social  history, and Family History were reviewed and updated.  Physical Exam: Blood pressure 118/83, pulse (!) 54, temperature 98.7 F (37.1 C), temperature source Oral, resp. rate 20, height 5\' 9"  (1.753 m), weight 235 lb 4.8 oz (106.7 kg), SpO2 99 %.   ECOG: 0 General appearance: alert, awake gentleman without distress. Head: no oral thrush or ulcers. Neck: no adenopathy or thyroid masses. Lymph nodes: Cervical, supraclavicular, and axillary nodes normal. Heart:regular rate and rhythm, S1, S2. No murmurs rubs or gallops. Lung:chest clear, no wheezing, rales.  Abdomin: soft, non-tender, without masses or organomegaly no rebound or guarding. EXT:no erythema, induration, or nodules no edema. Neurological: No deficits noted. Skin examination: No rashes or lesions.   Lab Results: Lab Results  Component Value Date   WBC 5.5 08/24/2017   HGB 13.6 08/24/2017   HCT 40.7 08/24/2017   MCV 86.1 08/24/2017   PLT 149 08/24/2017     Chemistry      Component Value Date/Time   NA 141 02/24/2017 0746   K 4.2 02/24/2017 0746   CL 103 06/28/2014 1235   CO2 20 (L) 02/24/2017 0746   BUN 18.2 02/24/2017 0746   CREATININE 1.4 (H) 02/24/2017 0746      Component Value Date/Time   CALCIUM 10.0 02/24/2017 0746   ALKPHOS 77 02/24/2017 0746   AST 34 02/24/2017 0746   ALT 24 02/24/2017 0746   BILITOT 0.91 02/24/2017 0746        Impression and Plan:  51 year old gentleman with the following issues:  1. Nonseminomatous testicular cancer presented with a T2 N0 stage IB disease. He has a 40% embryonal  component was lymphovascular invasion. He is S/P adjuvant chemotherapy in the form of 2 cycles of BEP.   CT scan Obtained on 02/24/2017 showed no evidence of recurrent disease. X-rays obtained in June 2018 were also reviewed and showed no evidence of malignancy.  The plan is to repeat imaging studies as well as tumor markers and April 2019. No further imaging studies will be needed after  that.    2. Age-appropriate cancer screening: He is up-to-date at this time.  3. Small pulmonary nodule: Benign etiology based on his recent scan in April 2018.   4. Followup: Will be in 6 months after repeat CT scans and laboratory data.    Zola Button, MD 10/16/20188:40 AM

## 2017-08-25 LAB — BETA HCG QUANT (REF LAB): hCG Quant: <1 m[IU]/mL (ref 0–3)

## 2017-08-25 LAB — AFP TUMOR MARKER: AFP, Serum, Tumor Marker: 3.6 ng/mL (ref 0.0–8.3)

## 2017-10-04 DIAGNOSIS — R0982 Postnasal drip: Secondary | ICD-10-CM | POA: Insufficient documentation

## 2017-10-28 DIAGNOSIS — K219 Gastro-esophageal reflux disease without esophagitis: Secondary | ICD-10-CM | POA: Insufficient documentation

## 2018-02-15 ENCOUNTER — Encounter (HOSPITAL_COMMUNITY): Payer: Self-pay

## 2018-02-15 ENCOUNTER — Ambulatory Visit (HOSPITAL_COMMUNITY)
Admission: RE | Admit: 2018-02-15 | Discharge: 2018-02-15 | Disposition: A | Discharge status: Home / Self Care | Payer: Managed Care, Other (non HMO) | Source: Ambulatory Visit | Attending: Oncology | Admitting: Oncology

## 2018-02-15 ENCOUNTER — Inpatient Hospital Stay: Payer: Managed Care, Other (non HMO) | Attending: Oncology

## 2018-02-15 DIAGNOSIS — C6290 Malignant neoplasm of unspecified testis, unspecified whether descended or undescended: Secondary | ICD-10-CM | POA: Insufficient documentation

## 2018-02-15 DIAGNOSIS — K5730 Diverticulosis of large intestine without perforation or abscess without bleeding: Secondary | ICD-10-CM | POA: Diagnosis not present

## 2018-02-15 DIAGNOSIS — Z8547 Personal history of malignant neoplasm of testis: Secondary | ICD-10-CM | POA: Diagnosis present

## 2018-02-15 DIAGNOSIS — Z08 Encounter for follow-up examination after completed treatment for malignant neoplasm: Secondary | ICD-10-CM | POA: Insufficient documentation

## 2018-02-15 DIAGNOSIS — R911 Solitary pulmonary nodule: Secondary | ICD-10-CM | POA: Diagnosis not present

## 2018-02-15 DIAGNOSIS — Z9221 Personal history of antineoplastic chemotherapy: Secondary | ICD-10-CM | POA: Insufficient documentation

## 2018-02-15 LAB — CBC WITH DIFFERENTIAL/PLATELET
Basophils Absolute: 0 10*3/uL (ref 0.0–0.1)
Basophils Relative: 1 %
EOS ABS: 0.1 10*3/uL (ref 0.0–0.5)
Eosinophils Relative: 2 %
HEMATOCRIT: 38.9 % (ref 38.4–49.9)
HEMOGLOBIN: 12.8 g/dL — AB (ref 13.0–17.1)
LYMPHS ABS: 1.3 10*3/uL (ref 0.9–3.3)
LYMPHS PCT: 24 %
MCH: 28.4 pg (ref 27.2–33.4)
MCHC: 33 g/dL (ref 32.0–36.0)
MCV: 86 fL (ref 79.3–98.0)
MONOS PCT: 8 %
Monocytes Absolute: 0.4 10*3/uL (ref 0.1–0.9)
NEUTROS PCT: 65 %
Neutro Abs: 3.4 10*3/uL (ref 1.5–6.5)
Platelets: 148 10*3/uL (ref 140–400)
RBC: 4.52 MIL/uL (ref 4.20–5.82)
RDW: 14.8 % — ABNORMAL HIGH (ref 11.0–14.6)
WBC: 5.2 10*3/uL (ref 4.0–10.3)

## 2018-02-15 LAB — COMPREHENSIVE METABOLIC PANEL
ALT: 23 U/L (ref 0–55)
AST: 33 U/L (ref 5–34)
Albumin: 4 g/dL (ref 3.5–5.0)
Alkaline Phosphatase: 65 U/L (ref 40–150)
Anion gap: 8 (ref 3–11)
BUN: 12 mg/dL (ref 7–26)
CHLORIDE: 109 mmol/L (ref 98–109)
CO2: 24 mmol/L (ref 22–29)
CREATININE: 1.37 mg/dL — AB (ref 0.70–1.30)
Calcium: 9.4 mg/dL (ref 8.4–10.4)
GFR calc non Af Amer: 59 mL/min — ABNORMAL LOW (ref >60)
Glucose, Bld: 84 mg/dL (ref 70–140)
POTASSIUM: 4.4 mmol/L (ref 3.5–5.1)
SODIUM: 141 mmol/L (ref 136–145)
Total Bilirubin: 0.6 mg/dL (ref 0.2–1.2)
Total Protein: 7.1 g/dL (ref 6.4–8.3)

## 2018-02-15 LAB — LACTATE DEHYDROGENASE: LDH: 213 U/L (ref 125–245)

## 2018-02-15 MED ORDER — IOPAMIDOL (ISOVUE-300) INJECTION 61%
100.0000 mL | Freq: Once | INTRAVENOUS | Status: AC | PRN
  Administered 2018-02-15: 100 mL via INTRAVENOUS

## 2018-02-16 LAB — AFP TUMOR MARKER: AFP, Serum, Tumor Marker: 3.3 ng/mL (ref 0.0–8.3)

## 2018-02-16 LAB — BETA HCG QUANT (REF LAB)

## 2018-02-22 ENCOUNTER — Telehealth: Payer: Self-pay

## 2018-02-22 ENCOUNTER — Inpatient Hospital Stay (HOSPITAL_BASED_OUTPATIENT_CLINIC_OR_DEPARTMENT_OTHER): Payer: Managed Care, Other (non HMO) | Admitting: Oncology

## 2018-02-22 VITALS — BP 134/85 | HR 57 | Temp 99.5°F | Resp 18 | Ht 69.0 in | Wt 232.7 lb

## 2018-02-22 DIAGNOSIS — Z9221 Personal history of antineoplastic chemotherapy: Secondary | ICD-10-CM

## 2018-02-22 DIAGNOSIS — C6290 Malignant neoplasm of unspecified testis, unspecified whether descended or undescended: Secondary | ICD-10-CM | POA: Diagnosis not present

## 2018-02-22 DIAGNOSIS — R911 Solitary pulmonary nodule: Secondary | ICD-10-CM

## 2018-02-22 DIAGNOSIS — C6292 Malignant neoplasm of left testis, unspecified whether descended or undescended: Secondary | ICD-10-CM

## 2018-02-22 NOTE — Progress Notes (Signed)
Hematology and Oncology Follow Up Visit  Roberto Ruiz [id number] October 30, 1966 52 y.o. 02/22/2018 7:41 AM Roberto Ruiz, Roberto Brow, MD   Principle Diagnosis: 52 year old man with stage IB nonseminomatous testis cancer diagnosed in 2015. He was found to have T2 N0  mixed histology with 60% seminoma, 40% embryonal and lymphovascular invasion was identified.   Prior Therapy:  He is status post left orchiectomy done on 11/13/2013.  He is S/P adjuvant chemotherapy in the form of 2 cycles of BEP completed 01/30/2014.  Current therapy: Active  surveillance.  Interim History: Roberto Ruiz is here for a follow up.  He continues to do well without any changes since the last visit.  He reports no hospitalizations or illnesses.  He continues to be active and attends activities of daily living.  He travels for an extended period of time because of his work and caused him to be fatigued periodically.  He denies any penile discharge or pelvic adenopathy.  He does not report any headaches, blurry vision, syncope or seizures. He did not report any fevers or chills or sweats. He does not report any chest pain, palpitation orthopnea or leg edema.  He denies any cough, wheezing or hemoptysis.  He does not report any nausea, vomiting or abdominal pain.  He does not report any frequency urgency or hesitancy.  He does not report any hematuria or dysuria.  He does not report any bone pain or pathological fractures.  He does not report any lymphadenopathy or petechiae. Rest of his review of system is negative.  Medications: I have reviewed the patient's current medications. He takes none at this time.    Allergies:  Allergies  Allergen Reactions  . Compazine [Prochlorperazine Edisylate] Other (See Comments)    Caused severe shaking and leg cramps  . Sulfa Antibiotics Hives    Past Medical History, Surgical history, Social history, and Family History were reviewed and updated.  Physical Exam:  Blood  pressure 134/85, pulse (!) 57, temperature 99.5 F (37.5 C), temperature source Oral, resp. rate 18, height 5\' 9"  (1.753 m), weight 232 lb 11.2 oz (105.6 kg), SpO2 100 %.   ECOG: 0 General appearance: Well-appearing gentleman without distress. Head: Atraumatic without abnormalities. Eyes: No scleral icterus. Oropharynx: Without thrush or ulcers. Lymph nodes: No lymphadenopathy palpated in the cervical, axillary or supraclavicular areas. Heart: Regular rate and rhythm without any murmurs or gallops. Lung: Clear to auscultation without any rhonchi, wheezes or dullness to percussion. Abdomin: Soft, nontender without any rebound or guarding. Musculoskeletal: No joint deformity or effusion.  No clubbing or cyanosis. Neurological: No motor or sensory deficits. Skin: No ecchymosis or petechiae.  Lab Results: Lab Results  Component Value Date   WBC 5.2 02/15/2018   HGB 12.8 (L) 02/15/2018   HCT 38.9 02/15/2018   MCV 86.0 02/15/2018   PLT 148 02/15/2018     Chemistry      Component Value Date/Time   NA 141 02/15/2018 0800   NA 141 08/24/2017 0806   K 4.4 02/15/2018 0800   K 4.3 08/24/2017 0806   CL 109 02/15/2018 0800   CO2 24 02/15/2018 0800   CO2 21 (L) 08/24/2017 0806   BUN 12 02/15/2018 0800   BUN 21.0 08/24/2017 0806   CREATININE 1.37 (H) 02/15/2018 0800   CREATININE 1.3 08/24/2017 0806      Component Value Date/Time   CALCIUM 9.4 02/15/2018 0800   CALCIUM 9.4 08/24/2017 0806   ALKPHOS 65 02/15/2018 0800   ALKPHOS 61 08/24/2017 0806  AST 33 02/15/2018 0800   AST 19 08/24/2017 0806   ALT 23 02/15/2018 0800   ALT 18 08/24/2017 0806   BILITOT 0.6 02/15/2018 0800   BILITOT 0.63 08/24/2017 0806     EXAM: CT CHEST, ABDOMEN, AND PELVIS WITH CONTRAST  TECHNIQUE: Multidetector CT imaging of the chest, abdomen and pelvis was performed following the standard protocol during bolus administration of intravenous contrast.  CONTRAST:  160mL ISOVUE-300 IOPAMIDOL  (ISOVUE-300) INJECTION 61%  COMPARISON:  02/24/2017  FINDINGS: CT CHEST FINDINGS  Cardiovascular: No acute findings.  Mediastinum/Lymph Nodes: No masses or pathologically enlarged lymph nodes identified. Small amount of pericardial fluid in the anterior portion of the superior aortic recess has increased since previous study, but is of no clinical significance.  Lungs/Pleura: No suspicious pulmonary nodules or masses are identified. No evidence of pulmonary infiltrate or pleural effusion. No evidence of central airway obstruction.  Musculoskeletal:  No suspicious bone lesions identified.  CT ABDOMEN AND PELVIS FINDINGS  Hepatobiliary: No masses identified. Gallbladder is unremarkable.  Pancreas:  No mass or inflammatory changes.  Spleen:  Within normal limits in size and appearance.  Adrenals/Urinary tract:  No masses or hydronephrosis.  Stomach/Bowel: No evidence of obstruction, inflammatory process, or abnormal fluid collections. Normal appendix visualized. Diverticulosis of the descending and sigmoid colon is again seen, however there is no evidence of diverticulitis.  Vascular/Lymphatic: No pathologically enlarged lymph nodes identified. No abdominal aortic aneurysm.  Reproductive: Normal size prostate and seminal vesicles. Stable postop changes from previous left orchiectomy.  Other:  None.  Musculoskeletal:  No suspicious bone lesions identified.  IMPRESSION: No evidence of metastatic disease within the chest, abdomen, or pelvis.  Colonic diverticulosis, without radiographic evidence of diverticulitis.     Impression and Plan:  52 year old man:   1.  Stage IB  nonseminomatous testicular cancer diagnosed in 2015.  He is status post orchiectomy with the pathology showed  40% embryonal component was lymphovascular invasion. He received adjuvant chemotherapy in the form of 2 cycles of BEP.   He remains on active surveillance at this  time without any evidence of recurrent disease since the time of diagnosis.  Laboratory data and CT scan obtained on February 15, 2018 were personally reviewed and discussed with the patient today.  He continues to show no evidence of recurrent disease at this time.  The natural course of this disease was reviewed again with the patient as well as surveillance recommendations.  I have recommended annual visits at this time and with repeat CT scan in 1 year.  No further imaging studies will be needed after 2020.  2. Age-appropriate cancer screening: He is up-to-date.  I urged him to get a colonoscopy this year.  3. Small pulmonary nodule: CT scan of the chest in April 2019 failed to demonstrate any change at this time.  4. Followup: Will be in 1 year.  15  minutes was spent with the patient face-to-face today.  More than 50% of time was dedicated to patient counseling, education and coordination of his care.   Zola Button, MD 4/16/20197:41 AM

## 2018-02-22 NOTE — Telephone Encounter (Signed)
Printed avs and calender of upcoming appointment. Per 4/16 los and also gave patient contrast.

## 2018-03-22 ENCOUNTER — Encounter: Payer: Self-pay | Admitting: Oncology

## 2018-03-22 NOTE — Progress Notes (Signed)
Patient's spouse called regarding concern of room charge and this was the first time she has seen this. She states she called customer service and was told to call the [facility]. Advised patient of the new billing process that went into effect in Jan of this year. She stets it doesn't seem fair and wanted to talk to someone else. Gave her Darlena C(team lead) number and also transferred her.

## 2018-03-24 IMAGING — CT CT ABD-PELV W/ CM
2 of 5 series · 15 of 46 positions shown, 17 images · IV contrast (OMNIPAQUE)
Comparison: 06/26/2015.  06/20/2014.

CLINICAL DATA: Testicular cancer, status post left orchiectomy.
Recent physical exam revealed a pea-sized subcutaneous nodule in the
mid axillary line of the left chest wall.

EXAM:
CT CHEST, ABDOMEN, AND PELVIS WITH CONTRAST
TECHNIQUE: Multidetector CT imaging of the chest, abdomen and pelvis was
performed following the standard protocol during bolus
administration of intravenous contrast.
CONTRAST:  100mL T5ZOCX-N88 IOPAMIDOL (T5ZOCX-N88) INJECTION 61%

[Series 2: cap with st · axial · 0.97mm/px · z∈[-738,-163]mm · 12 of 135 slices shown, 14 images]
[im 10/135  soft-tissue]
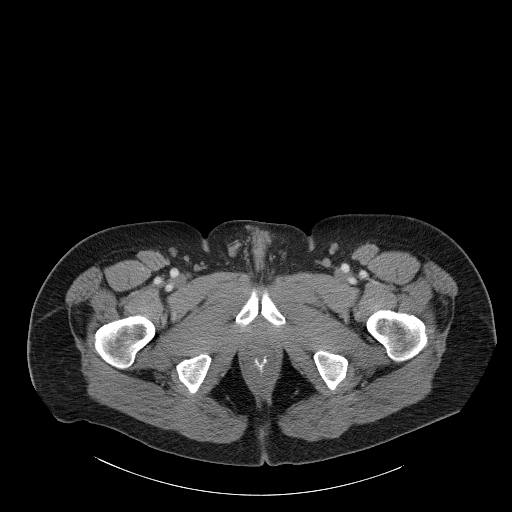
[im 10/135  bone]
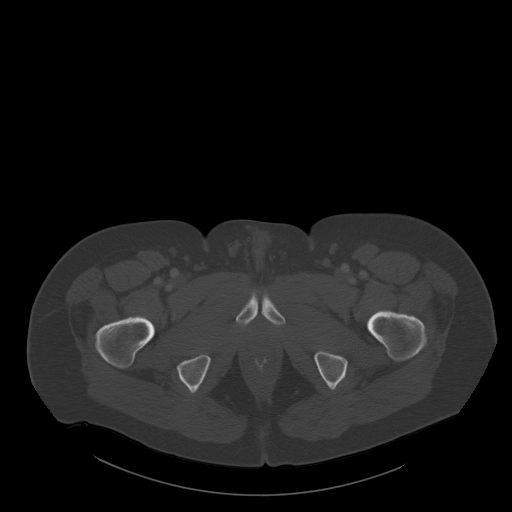
[im 20/135  soft-tissue]
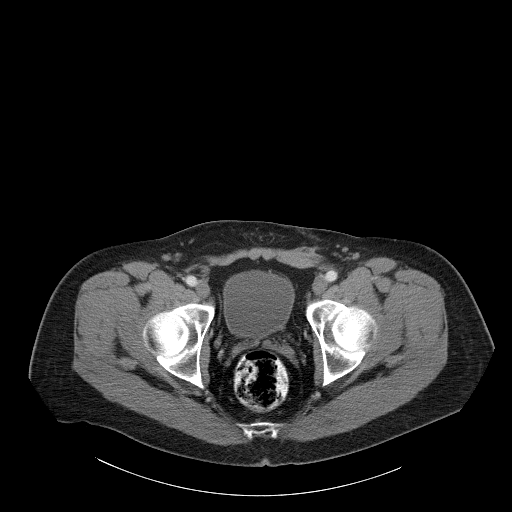
[im 29/135  soft-tissue]
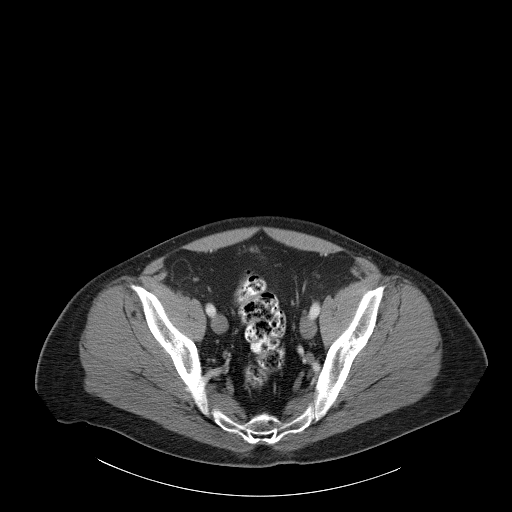
[im 39/135  soft-tissue]
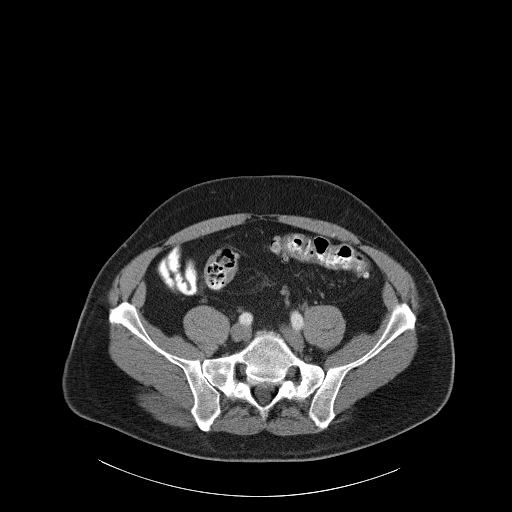
[im 48/135  soft-tissue]
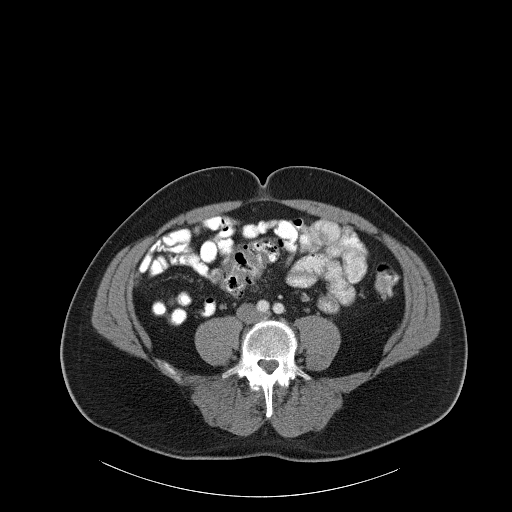
[im 58/135  soft-tissue]
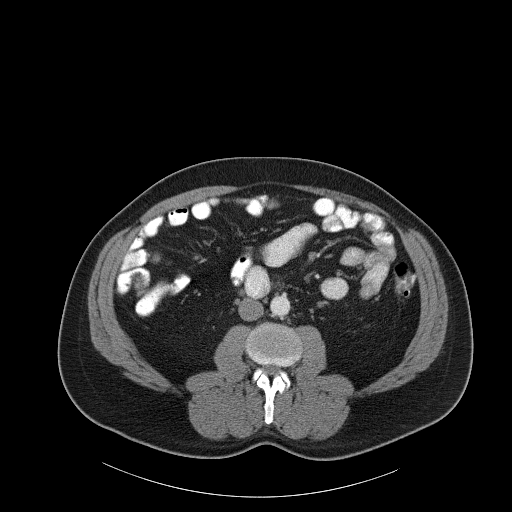
[im 77/135  soft-tissue]
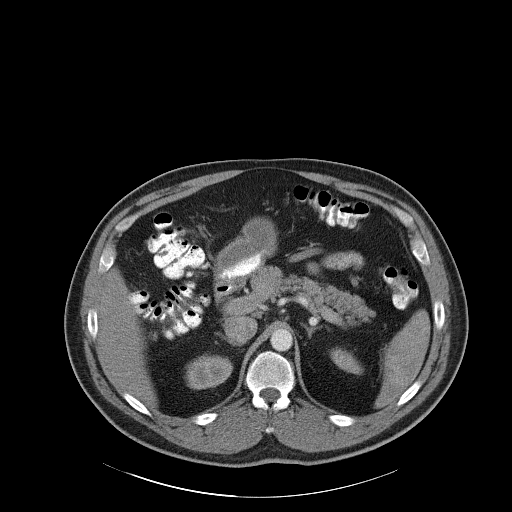
[im 87/135  soft-tissue]
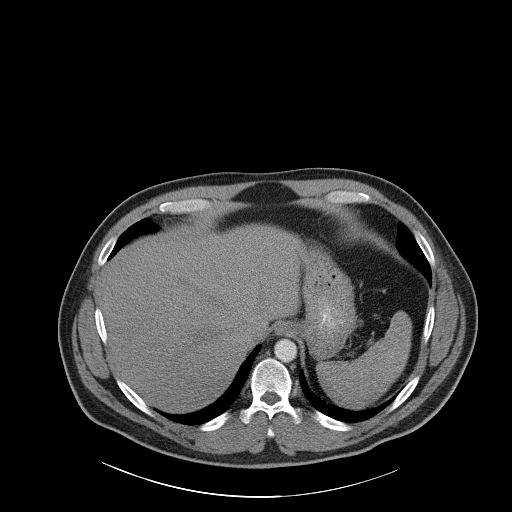
[im 96/135  soft-tissue]
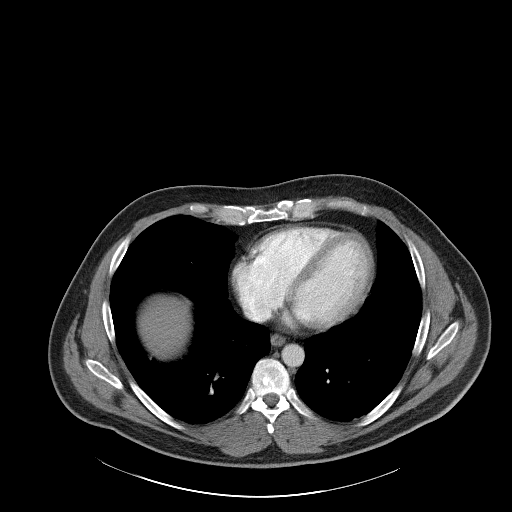
[im 96/135  bone]
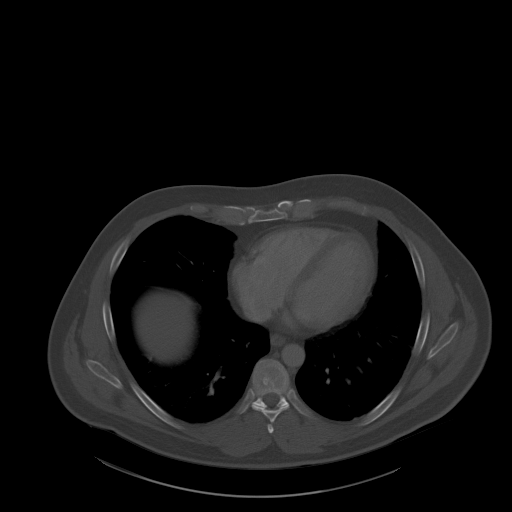
[im 106/135  soft-tissue]
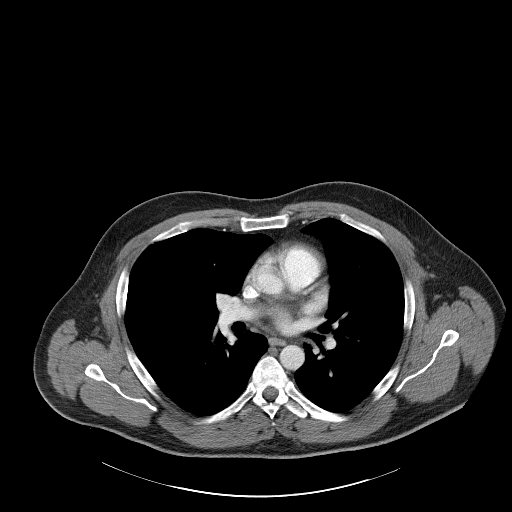
[im 115/135  soft-tissue]
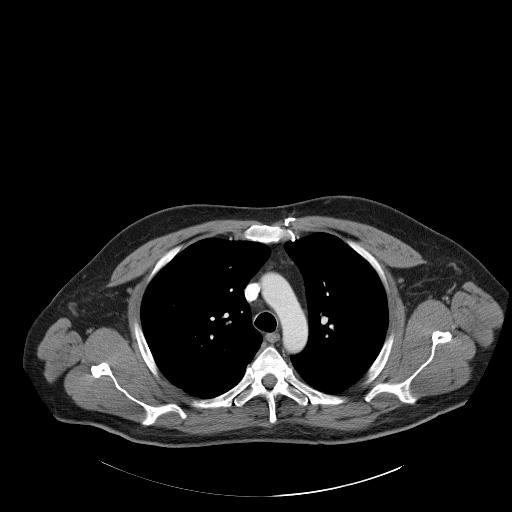
[im 125/135  soft-tissue]
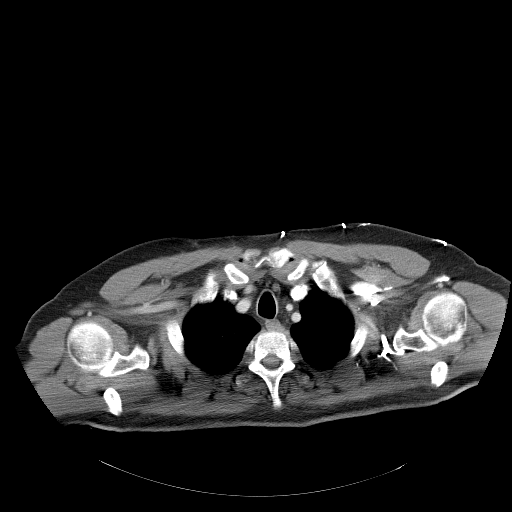

[Series 603: <mpr thick range(1)> · coronal · 1.32mm/px · 3 of 163 slices shown]
[im 55/163  soft-tissue]
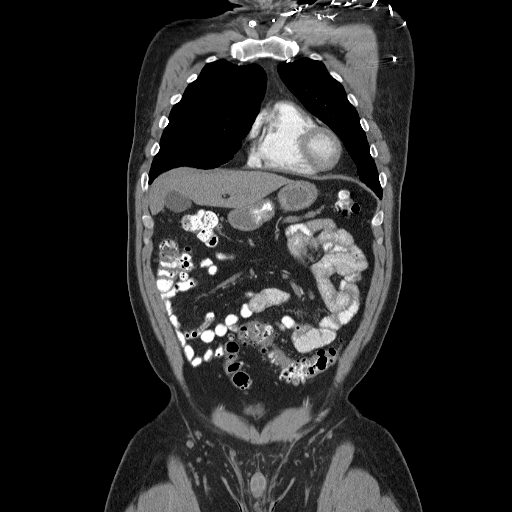
[im 73/163  soft-tissue]
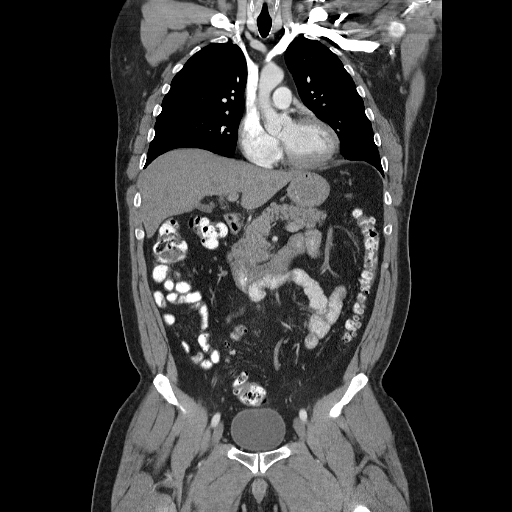
[im 91/163  soft-tissue]
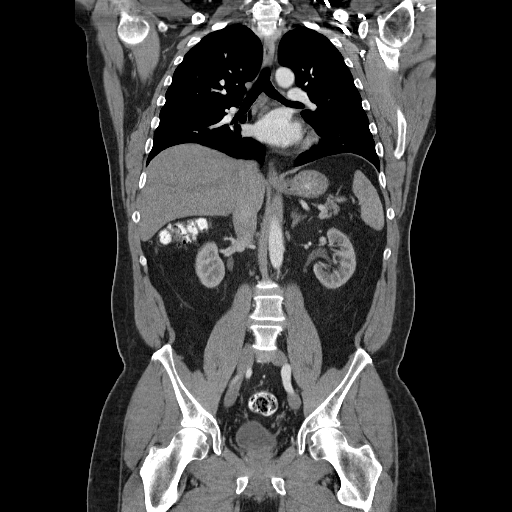

[15 of 46 positions shown; findings below may reference images not displayed]

FINDINGS: CT CHEST FINDINGS

Mediastinum/Lymph Nodes: There is no axillary lymphadenopathy. No
mediastinal lymphadenopathy. There is no hilar lymphadenopathy. The
heart size is normal. No pericardial effusion. The esophagus has
normal imaging features.

Lungs/Pleura: No suspicious pulmonary nodule or mass. But no focal
airspace consolidation. No pulmonary edema or pleural effusion.
Subsegmental atelectasis is noted in the lower lobes bilaterally,
left greater than right. 4 mm ill-defined nodular focus in the
posterior left lower lobe (image 109 series 4) is most likely
atelectatic.

Musculoskeletal: Bone windows reveal no worrisome lytic or sclerotic
osseous lesions. Evaluation of the left lateral chest wall in the
mid axillary line reveals no lymphadenopathy or new subcutaneous
mass lesion.

CT ABDOMEN PELVIS FINDINGS

Hepatobiliary: No focal abnormality within the liver parenchyma.
There is no evidence for gallstones, gallbladder wall thickening, or
pericholecystic fluid. No intrahepatic or extrahepatic biliary
dilation.

Pancreas: No focal mass lesion. No dilatation of the main duct. No
intraparenchymal cyst. No peripancreatic edema.

Spleen: No splenomegaly. No focal mass lesion.

Adrenals/Urinary Tract: No adrenal nodule or mass. No evidence for
enhancing lesion in either kidney. There is no hydronephrosis. No
evidence for hydroureter. The urinary bladder appears normal for the
degree of distention.

Stomach/Bowel: Stomach is nondistended. No gastric wall thickening.
No evidence of outlet obstruction. Duodenum is normally positioned
as is the ligament of Treitz. No small bowel wall thickening. No
small bowel dilatation. The terminal ileum is normal. The appendix
is normal. Diverticular changes are noted in the left colon without
evidence of diverticulitis.

Vascular/Lymphatic: No abdominal aortic aneurysm. No abdominal
aortic atherosclerotic calcification. There is no gastrohepatic or
hepatoduodenal ligament lymphadenopathy. No intraperitoneal or
retroperitoneal lymphadenopathy. No pelvic sidewall lymphadenopathy.

Reproductive: The prostate gland and seminal vesicles have normal
imaging features.

Other: No intraperitoneal free fluid.

Musculoskeletal: Bone windows reveal no worrisome lytic or sclerotic
osseous lesions.
IMPRESSION: 1. Stable exam. No new or progressive findings to suggest metastatic
disease in the chest, abdomen, or pelvis.
2. Dependent atelectasis in the lung bases with a 4 mm nodular area
in the posterior left lower lobe, most likely related to the
atelectasis, but attention on follow-up recommended.
3. Physical exam revealed a small subcutaneous nodule in the left
chest wall, along the mid axillary line. No underlying
lymphadenopathy or soft tissue mass is evident by CT.

## 2018-09-12 ENCOUNTER — Encounter: Payer: Self-pay | Admitting: Gastroenterology

## 2018-10-03 ENCOUNTER — Ambulatory Visit (AMBULATORY_SURGERY_CENTER): Payer: Self-pay | Admitting: *Deleted

## 2018-10-03 VITALS — Ht 69.0 in | Wt 235.0 lb

## 2018-10-03 DIAGNOSIS — Z1211 Encounter for screening for malignant neoplasm of colon: Secondary | ICD-10-CM

## 2018-10-03 MED ORDER — PEG 3350-KCL-NA BICARB-NACL 420 G PO SOLR: 4000.0000 mL | Freq: Once | ORAL | 0 refills | Status: AC

## 2018-10-03 NOTE — Progress Notes (Signed)
Patient denies any allergies to eggs or soy. Patient denies any problems with anesthesia/sedation. Patient denies any oxygen use at home. Patient denies taking any diet/weight loss medications or blood thinners. EMMI education offered, pt declined.  

## 2018-10-04 ENCOUNTER — Telehealth: Payer: Self-pay | Admitting: Gastroenterology

## 2018-10-04 MED ORDER — PEG 3350-KCL-NA BICARB-NACL 420 G PO SOLR: 4000.0000 mL | Freq: Once | ORAL | 0 refills | Status: AC

## 2018-10-04 NOTE — Telephone Encounter (Signed)
Prep resent to Pharmacy- pt notified. Roberto Ruiz PV

## 2018-10-05 ENCOUNTER — Encounter: Payer: Self-pay | Admitting: Gastroenterology

## 2018-10-21 ENCOUNTER — Ambulatory Visit (AMBULATORY_SURGERY_CENTER): Payer: Managed Care, Other (non HMO) | Admitting: Gastroenterology

## 2018-10-21 ENCOUNTER — Encounter: Payer: Self-pay | Admitting: Gastroenterology

## 2018-10-21 VITALS — BP 110/80 | HR 64 | Temp 97.7°F | Resp 10 | Ht 69.0 in | Wt 235.0 lb

## 2018-10-21 DIAGNOSIS — Z1211 Encounter for screening for malignant neoplasm of colon: Secondary | ICD-10-CM | POA: Diagnosis present

## 2018-10-21 DIAGNOSIS — K5730 Diverticulosis of large intestine without perforation or abscess without bleeding: Secondary | ICD-10-CM

## 2018-10-21 MED ORDER — SODIUM CHLORIDE 0.9 % IV SOLN: 500.0000 mL | Freq: Once | INTRAVENOUS | Status: DC

## 2018-10-21 NOTE — Progress Notes (Signed)
Pt's states no medical or surgical changes since previsit or office visit. 

## 2018-10-21 NOTE — Op Note (Signed)
[name] Procedure Date: 10/21/2018 1:35 PM MRN: [id number] Endoscopist: Milus Banister , MD Age: 52 Referring MD:  Date of Birth: 1965-12-28 Gender: Male Account #: [id number] Procedure:                Colonoscopy Indications:              Screening for colorectal malignant neoplasm Medicines:                Monitored Anesthesia Care Procedure:                Pre-Anesthesia Assessment:                           - Prior to the procedure, a History and Physical                            was performed, and patient medications and                            allergies were reviewed. The patient's tolerance of                            previous anesthesia was also reviewed. The risks                            and benefits of the procedure and the sedation                            options and risks were discussed with the patient.                            All questions were answered, and informed consent                            was obtained. Prior Anticoagulants: The patient has                            taken no previous anticoagulant or antiplatelet                            agents. ASA Grade Assessment: II - A patient with                            mild systemic disease. After reviewing the risks                            and benefits, the patient was deemed in                            satisfactory condition to undergo the procedure.                           After obtaining informed consent, the colonoscope  was passed under direct vision. Throughout the                            procedure, the patient's blood pressure, pulse, and                            oxygen saturations were monitored continuously. The                            Colonoscope was introduced through the anus and                            advanced to the the cecum, identified by                            appendiceal orifice and  ileocecal valve. The                            colonoscopy was performed without difficulty. The                            patient tolerated the procedure well. The quality                            of the bowel preparation was good. The ileocecal                            valve, appendiceal orifice, and rectum were                            photographed. Scope In: 1:44:00 PM Scope Out: 1:57:38 PM Scope Withdrawal Time: 0 hours 10 minutes 28 seconds  Total Procedure Duration: 0 hours 13 minutes 38 seconds  Findings:                 Multiple small and large-mouthed diverticula were                            found in the left colon.                           The exam was otherwise without abnormality on                            direct and retroflexion views. Complications:            No immediate complications. Estimated blood loss:                            None. Estimated Blood Loss:     Estimated blood loss: none. Impression:               - Diverticulosis in the left colon.                           - The examination was otherwise normal on direct  and retroflexion views.                           - No polyps or cancers. Recommendation:           - Patient has a contact number available for                            emergencies. The signs and symptoms of potential                            delayed complications were discussed with the                            patient. Return to normal activities tomorrow.                            Written discharge instructions were provided to the                            patient.                           - Resume previous diet.                           - Continue present medications.                           - Repeat colonoscopy in 10 years for screening. Milus Banister, MD 10/21/2018 1:59:38 PM This report has been signed electronically.

## 2018-10-21 NOTE — Progress Notes (Signed)
PT taken to PACU. Monitors in place. VSS. Report given to RN. 

## 2018-10-21 NOTE — Patient Instructions (Signed)
YOU HAD AN ENDOSCOPIC PROCEDURE TODAY AT [facility]:   Refer to the procedure report that was given to you for any specific questions about what was found during the examination.  If the procedure report does not answer your questions, please call your gastroenterologist to clarify.  If you requested that your care partner not be given the details of your procedure findings, then the procedure report has been included in a sealed envelope for you to review at your convenience later.  YOU SHOULD EXPECT: Some feelings of bloating in the abdomen. Passage of more gas than usual.  Walking can help get rid of the air that was put into your GI tract during the procedure and reduce the bloating. If you had a lower endoscopy (such as a colonoscopy or flexible sigmoidoscopy) you may notice spotting of blood in your stool or on the toilet paper. If you underwent a bowel prep for your procedure, you may not have a normal bowel movement for a few days.  Please Note:  You might notice some irritation and congestion in your nose or some drainage.  This is from the oxygen used during your procedure.  There is no need for concern and it should clear up in a day or so.  SYMPTOMS TO REPORT IMMEDIATELY:   Following lower endoscopy (colonoscopy or flexible sigmoidoscopy):  Excessive amounts of blood in the stool  Significant tenderness or worsening of abdominal pains  Swelling of the abdomen that is new, acute  Fever of 100F or higher  For urgent or emergent issues, a gastroenterologist can be reached at any hour by calling [phone].   DIET:  We do recommend a small meal at first, but then you may proceed to your regular diet.  Drink plenty of fluids but you should avoid alcoholic beverages for 24 hours.  ACTIVITY:  You should plan to take it easy for the rest of today and you should NOT DRIVE or use heavy machinery until tomorrow (because of the sedation medicines used during the test).     FOLLOW UP: Our staff will call the number listed on your records the next business day following your procedure to check on you and address any questions or concerns that you may have regarding the information given to you following your procedure. If we do not reach you, we will leave a message.  However, if you are feeling well and you are not experiencing any problems, there is no need to return our call.  We will assume that you have returned to your regular daily activities without incident.  If any biopsies were taken you will be contacted by phone or by letter within the next 1-3 weeks.  Please call us at [phone] if you have not heard about the biopsies in 3 weeks.    SIGNATURES/CONFIDENTIALITY: You and/or your care partner have signed paperwork which will be entered into your electronic medical record.  These signatures attest to the fact that that the information above on your After Visit Summary has been reviewed and is understood.  Full responsibility of the confidentiality of this discharge information lies with you and/or your care-partner   , Recall colonoscopy 10 years,2029  Diverticulosis information given

## 2018-10-24 ENCOUNTER — Telehealth: Payer: Self-pay

## 2018-10-24 NOTE — Telephone Encounter (Signed)
  Follow up Call-  Call back number 10/21/2018  Post procedure Call Back phone  # [phone]  Permission to leave phone message Yes  Some recent data might be hidden     Patient questions:  Do you have a fever, pain , or abdominal swelling? No. Pain Score  0 *  Have you tolerated food without any problems? Yes.    Have you been able to return to your normal activities? Yes.    Do you have any questions about your discharge instructions: Diet   No. Medications  No. Follow up visit  No.  Do you have questions or concerns about your Care? No.  Actions: * If pain score is 4 or above: No action needed, pain <4.

## 2019-02-14 ENCOUNTER — Inpatient Hospital Stay: Payer: Managed Care, Other (non HMO) | Attending: Oncology

## 2019-02-14 ENCOUNTER — Ambulatory Visit (HOSPITAL_COMMUNITY)
Admission: RE | Admit: 2019-02-14 | Discharge: 2019-02-14 | Disposition: A | Discharge status: Home / Self Care | Payer: Managed Care, Other (non HMO) | Source: Ambulatory Visit | Attending: Oncology | Admitting: Oncology

## 2019-02-14 ENCOUNTER — Other Ambulatory Visit: Payer: Self-pay

## 2019-02-14 ENCOUNTER — Telehealth: Payer: Self-pay | Admitting: Oncology

## 2019-02-14 DIAGNOSIS — C6292 Malignant neoplasm of left testis, unspecified whether descended or undescended: Secondary | ICD-10-CM | POA: Diagnosis present

## 2019-02-14 DIAGNOSIS — R918 Other nonspecific abnormal finding of lung field: Secondary | ICD-10-CM | POA: Diagnosis not present

## 2019-02-14 DIAGNOSIS — Z8547 Personal history of malignant neoplasm of testis: Secondary | ICD-10-CM | POA: Insufficient documentation

## 2019-02-14 DIAGNOSIS — Z9221 Personal history of antineoplastic chemotherapy: Secondary | ICD-10-CM | POA: Diagnosis not present

## 2019-02-14 LAB — CMP (CANCER CENTER ONLY)
ALT: 20 U/L (ref 0–44)
AST: 20 U/L (ref 15–41)
Albumin: 4.4 g/dL (ref 3.5–5.0)
Alkaline Phosphatase: 72 U/L (ref 38–126)
Anion gap: 9 (ref 5–15)
BUN: 16 mg/dL (ref 6–20)
CO2: 24 mmol/L (ref 22–32)
Calcium: 9.3 mg/dL (ref 8.9–10.3)
Chloride: 108 mmol/L (ref 98–111)
Creatinine: 1.26 mg/dL — ABNORMAL HIGH (ref 0.61–1.24)
GFR, Est AFR Am: >60 mL/min (ref >60)
GFR, Estimated: >60 mL/min (ref >60)
Glucose, Bld: 81 mg/dL (ref 70–99)
Potassium: 4.3 mmol/L (ref 3.5–5.1)
Sodium: 141 mmol/L (ref 135–145)
Total Bilirubin: 0.7 mg/dL (ref 0.3–1.2)
Total Protein: 7.7 g/dL (ref 6.5–8.1)

## 2019-02-14 LAB — CBC WITH DIFFERENTIAL (CANCER CENTER ONLY)
Abs Immature Granulocytes: 0.01 10*3/uL (ref 0.00–0.07)
Basophils Absolute: 0 10*3/uL (ref 0.0–0.1)
Basophils Relative: 1 %
Eosinophils Absolute: 0.1 10*3/uL (ref 0.0–0.5)
Eosinophils Relative: 1 %
HCT: 45.2 % (ref 39.0–52.0)
Hemoglobin: 14.5 g/dL (ref 13.0–17.0)
Immature Granulocytes: 0 %
Lymphocytes Relative: 25 %
Lymphs Abs: 1.5 10*3/uL (ref 0.7–4.0)
MCH: 28.9 pg (ref 26.0–34.0)
MCHC: 32.1 g/dL (ref 30.0–36.0)
MCV: 90.2 fL (ref 80.0–100.0)
Monocytes Absolute: 0.4 10*3/uL (ref 0.1–1.0)
Monocytes Relative: 7 %
Neutro Abs: 3.9 10*3/uL (ref 1.7–7.7)
Neutrophils Relative %: 66 %
Platelet Count: 180 10*3/uL (ref 150–400)
RBC: 5.01 MIL/uL (ref 4.22–5.81)
RDW: 13.1 % (ref 11.5–15.5)
WBC Count: 5.9 10*3/uL (ref 4.0–10.5)
nRBC: 0 % (ref 0.0–0.2)

## 2019-02-14 LAB — LACTATE DEHYDROGENASE: LDH: 304 U/L — ABNORMAL HIGH (ref 98–192)

## 2019-02-14 MED ORDER — IOHEXOL 300 MG/ML  SOLN
100.0000 mL | Freq: Once | INTRAMUSCULAR | Status: AC | PRN
  Administered 2019-02-14: 100 mL via INTRAVENOUS

## 2019-02-14 MED ORDER — SODIUM CHLORIDE (PF) 0.9 % IJ SOLN
INTRAMUSCULAR | Status: AC
  Filled 2019-02-14: qty 50

## 2019-02-14 NOTE — Telephone Encounter (Signed)
Per 4/2 schedule message changed 4/14 f/u to telephone visit. Spoke with patient today while in office for lab.

## 2019-02-15 LAB — AFP TUMOR MARKER: AFP, Serum, Tumor Marker: 3.6 ng/mL (ref 0.0–8.3)

## 2019-02-15 LAB — BETA HCG QUANT (REF LAB): hCG Quant: <1 m[IU]/mL (ref 0–3)

## 2019-02-21 ENCOUNTER — Inpatient Hospital Stay (HOSPITAL_BASED_OUTPATIENT_CLINIC_OR_DEPARTMENT_OTHER): Payer: Managed Care, Other (non HMO) | Admitting: Oncology

## 2019-02-21 ENCOUNTER — Telehealth: Payer: Self-pay | Admitting: Oncology

## 2019-02-21 DIAGNOSIS — Z8547 Personal history of malignant neoplasm of testis: Secondary | ICD-10-CM

## 2019-02-21 DIAGNOSIS — C6292 Malignant neoplasm of left testis, unspecified whether descended or undescended: Secondary | ICD-10-CM

## 2019-02-21 DIAGNOSIS — Z9221 Personal history of antineoplastic chemotherapy: Secondary | ICD-10-CM

## 2019-02-21 DIAGNOSIS — R918 Other nonspecific abnormal finding of lung field: Secondary | ICD-10-CM | POA: Diagnosis not present

## 2019-02-21 NOTE — Telephone Encounter (Signed)
Scheduled appt per 4/14 sch message  

## 2019-02-21 NOTE — Progress Notes (Signed)
Hematology and Oncology Follow Up for Telemedicine Visits  Roberto Ruiz [id number] 1966-05-10 53 y.o. 02/21/2019 7:46 AM Tarri Abernethy, MD   I connected with Roberto Ruiz on 02/21/19 at  8:30 AM EDT by telephone visit and verified that I am speaking with the correct person using two identifiers.   I discussed the limitations, risks, security and privacy concerns of performing an evaluation and management service by telemedicine and the availability of in-person appointments. I also discussed with the patient that there may be a patient responsible charge related to this service. The patient expressed understanding and agreed to proceed.  Other persons participating in the visit and their role in the encounter: None  Patient's location: Home Provider's location: Office  Chief Complaint: No complaints.  Principle Diagnosis: 53 year old with stage Ib testicular cancer diagnosed in 2015.  He was found to have stage I PT2N0 disease with 60% seminoma and 40% embryonal.   Prior Therapy: He is status post left orchiectomy done on 11/13/2013.  Followed by adjuvant chemotherapy in the form of 2 cycles of BEP completed 01/30/2014  Current therapy: Active surveillance.  Interim History: Roberto Ruiz reports no major changes in his health.  He denies any recent illnesses or hospitalizations.  He denies any abdominal pain or discomfort.  He completed a colonoscopy in December 2019 without any abnormalities.  He denies testicular masses or lesions.  He denies any weight loss or appetite changes.  Remains active and attends activities of daily living.     Medications: I have reviewed the patient's current medications.  Current Outpatient Medications  Medication Sig Dispense Refill  . Multiple Vitamin (MULTIVITAMIN) tablet Take 1 tablet by mouth daily.     No current facility-administered medications for this visit.      Allergies:  Allergies  Allergen Reactions  . Compazine  [Prochlorperazine Edisylate] Other (See Comments)    Caused severe shaking and leg cramps Caused sever shaking and leg cramps Caused severe shaking and leg cramps Caused sever shaking and leg cramps   . Sulfa Antibiotics Hives and Other (See Comments)  . Sulfasalazine Hives    Past Medical History, Surgical history, Social history, and Family History were reviewed and updated.     Lab Results: Lab Results  Component Value Date   WBC 5.9 02/14/2019   HGB 14.5 02/14/2019   HCT 45.2 02/14/2019   MCV 90.2 02/14/2019   PLT 180 02/14/2019     Chemistry      Component Value Date/Time   NA 141 02/14/2019 1108   NA 141 08/24/2017 0806   K 4.3 02/14/2019 1108   K 4.3 08/24/2017 0806   CL 108 02/14/2019 1108   CO2 24 02/14/2019 1108   CO2 21 (L) 08/24/2017 0806   BUN 16 02/14/2019 1108   BUN 21.0 08/24/2017 0806   CREATININE 1.26 (H) 02/14/2019 1108   CREATININE 1.3 08/24/2017 0806      Component Value Date/Time   CALCIUM 9.3 02/14/2019 1108   CALCIUM 9.4 08/24/2017 0806   ALKPHOS 72 02/14/2019 1108   ALKPHOS 61 08/24/2017 0806   AST 20 02/14/2019 1108   AST 19 08/24/2017 0806   ALT 20 02/14/2019 1108   ALT 18 08/24/2017 0806   BILITOT 0.7 02/14/2019 1108   BILITOT 0.63 08/24/2017 0806     Results for Roberto Ruiz, Roberto Ruiz (MRN [id number]) as of 02/21/2019 07:48  Ref. Range 02/14/2019 11:08  AFP, Serum, Tumor Marker Latest Ref Range: 0.0 - 8.3 ng/mL 3.6  hCG Quant Latest Ref Range: 0 - 3 mIU/mL <1    Radiological Studies:  CONTRAST:  171mL OMNIPAQUE IOHEXOL 300 MG/ML SOLN, additional oral enteric contrast  COMPARISON:  02/15/2018  FINDINGS: CT CHEST FINDINGS  Cardiovascular: No significant vascular findings. Normal heart size. No pericardial effusion.  Mediastinum/Nodes: No enlarged mediastinal, hilar, or axillary lymph nodes. Thyroid gland, trachea, and esophagus demonstrate no significant findings.  Lungs/Pleura: Lungs are clear. No pleural effusion or  pneumothorax.  Musculoskeletal: No chest wall mass or suspicious bone lesions identified.  CT ABDOMEN PELVIS FINDINGS  Hepatobiliary: No focal liver abnormality is seen. No gallstones, gallbladder wall thickening, or biliary dilatation.  Pancreas: Unremarkable. No pancreatic ductal dilatation or surrounding inflammatory changes.  Spleen: Normal in size without focal abnormality.  Adrenals/Urinary Tract: Adrenal glands are unremarkable. Kidneys are normal, without renal calculi, focal lesion, or hydronephrosis. Bladder is unremarkable.  Stomach/Bowel: Stomach is within normal limits. Appendix appears normal. No evidence of bowel wall thickening, distention, or inflammatory changes. Descending and sigmoid diverticulosis.  Vascular/Lymphatic: No significant vascular findings are present. No enlarged abdominal or pelvic lymph nodes.  Reproductive: No mass or other abnormality.  Other: No abdominal wall hernia or abnormality. Status post left orchiectomy. No abdominopelvic ascites.  Musculoskeletal: No acute or significant osseous findings.  IMPRESSION: No evidence of metastatic disease in the chest, abdomen, or pelvis.  Impression and Plan:  53 year old man:   1.    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  He is status post orchiectomy followed by adjuvant chemotherapy for 2 cycles.  He has remained disease-free in the last 5 years without any evidence of recurrent disease.  Laboratory data and CT scan obtained on February 14, 2019 were personally reviewed and discussed with the patient.  He has no evidence to suggest recurrent disease.  The natural course of this disease was reviewed again and risk of relapse was discussed.  I recommended annual follow-up for physical examination and laboratory testing and no further imaging.  CT scan will be done only as needed.  2. Age-appropriate cancer screening:  He  status post colonoscopy completed in December 2019.  He is up-to-date otherwise.  3. Small pulmonary nodule:  No pulmonary nodules noted on his CT scan April 7 of 2020.  4. Followup: Will be in 1 year.  I discussed the assessment and treatment plan with the patient. The patient was provided an opportunity to ask questions and all were answered. The patient agreed with the plan and demonstrated an understanding of the instructions.   The patient was advised to call back or seek an in-person evaluation if the symptoms worsen or if the condition fails to improve as anticipated.  I provided 20 minutes of non face-to-face telephone visit time during this encounter, and > 50% was dedicated to reviewing laboratory data, CT scan, discussing his disease status and the risk of relapse and answering questions regarding future plan of care.  Zola Button, MD 02/21/2019 7:46 AM

## 2019-10-10 ENCOUNTER — Ambulatory Visit (INDEPENDENT_AMBULATORY_CARE_PROVIDER_SITE_OTHER): Payer: Managed Care, Other (non HMO) | Admitting: Gastroenterology

## 2019-10-10 ENCOUNTER — Encounter: Payer: Self-pay | Admitting: Gastroenterology

## 2019-10-10 ENCOUNTER — Other Ambulatory Visit: Payer: Self-pay

## 2019-10-10 VITALS — BP 116/76 | HR 72 | Temp 97.8°F | Ht 69.0 in | Wt 238.0 lb

## 2019-10-10 DIAGNOSIS — K219 Gastro-esophageal reflux disease without esophagitis: Secondary | ICD-10-CM

## 2019-10-10 NOTE — Patient Instructions (Addendum)
If you are age 51 or older, your body mass index should be between 23-30. Your Body mass index is 35.15 kg/m. If this is out of the aforementioned range listed, please consider follow up with your Primary Care Provider.  If you are age 55 or younger, your body mass index should be between 19-25. Your Body mass index is 35.15 kg/m. If this is out of the aformentioned range listed, please consider follow up with your Primary Care Provider.     Protonix 40 mg (2 tablets) daily take 20-30 minutes before dinner.    Pepcid (famotidine) 20 mg nightly.    No caffeine after 12 pm (noon).    No food within 3 hours prior to bed.   Call in 4 weeks with an update.

## 2019-10-10 NOTE — Progress Notes (Signed)
Review of pertinent gastrointestinal problems: 1. Routine risk for colon cancer. Colonoscopy December 2019 Dr. Ardis Hughs for routine risk screening found diverticulosis in the left colon. The examination was otherwise normal. No polyps or cancers. He was recommended to have a repeat colonoscopy for colon cancer screening in 10 years   HPI: This is a very pleasant 53 year old man whom I last saw at the time of a colonoscopy about a year ago.  He is here today for a different problem  Chief complaint is GERD  He has had intermittent mild GERD symptoms for probably years however the past month or so they have definitely worsened.  He is having waterbrash at night, he is having coughing and gagging in the morning hours nausea in the morning.  Is having heartburn several times throughout the day.  He has no alarm symptoms, specifically no dysphagia, no overt GI bleeding, no unintentional weight loss and in fact his weight has been stable for years.  He is taking 20 mg Protonix in the early morning and also at 8 PM which is about 2 hours before his bedtime and shortly after his dinner meal.  He does not really eat her breakfast meal.    Review of systems: Pertinent positive and negative review of systems were noted in the above HPI section. All other review negative.   Past Medical History:  Diagnosis Date  . Cancer [facility]) 2013   testicular  . GERD (gastroesophageal reflux disease)    with Chemo  . Wears contact lenses     Past Surgical History:  Procedure Laterality Date  . ORCHIECTOMY Left 11/13/2013   Procedure: LEFT RADICAL INGUINAL ORCHIECTOMY;  Surgeon: Molli Hazard, MD;  Location: [facility];  Service: Urology;  Laterality: Left;  . right arthroscopy w/ open removal of reputured bursa  1987  . TESTICLE REMOVAL  2013  . TONSILLECTOMY AND ADENOIDECTOMY  1990    Current Outpatient Medications  Medication Sig Dispense Refill  . cyclobenzaprine (FLEXERIL) 5 MG  tablet Take by mouth.    . pantoprazole (PROTONIX) 20 MG tablet Take by mouth.    . Multiple Vitamin (MULTIVITAMIN) tablet Take 1 tablet by mouth daily.     No current facility-administered medications for this visit.     Allergies as of 10/10/2019 - Review Complete 10/21/2018  Allergen Reaction Noted  . Compazine [prochlorperazine edisylate] Other (See Comments) 01/23/2014  . Sulfa antibiotics Hives and Other (See Comments) 11/13/2013  . Sulfasalazine Hives 11/13/2013    Family History  Problem Relation Age of Onset  . Colon cancer Neg Hx   . Colon polyps Neg Hx   . Esophageal cancer Neg Hx   . Rectal cancer Neg Hx   . Stomach cancer Neg Hx     Social History   Socioeconomic History  . Marital status: Married    Spouse name: Not on file  . Number of children: Not on file  . Years of education: Not on file  . Highest education level: Not on file  Occupational History  . Not on file  Social Needs  . Financial resource strain: Not on file  . Food insecurity    Worry: Not on file    Inability: Not on file  . Transportation needs    Medical: Not on file    Non-medical: Not on file  Tobacco Use  . Smoking status: Never Smoker  . Smokeless tobacco: Never Used  Substance and Sexual Activity  . Alcohol use: No  .  Drug use: No  . Sexual activity: Not on file  Lifestyle  . Physical activity    Days per week: Not on file    Minutes per session: Not on file  . Stress: Not on file  Relationships  . Social Herbalist on phone: Not on file    Gets together: Not on file    Attends religious service: Not on file    Active member of club or organization: Not on file    Attends meetings of clubs or organizations: Not on file    Relationship status: Not on file  . Intimate partner violence    Fear of current or ex partner: Not on file    Emotionally abused: Not on file    Physically abused: Not on file    Forced sexual activity: Not on file  Other Topics  Concern  . Not on file  Social History Narrative  . Not on file     Physical Exam: BP 116/76   Pulse 72   Temp 97.8 F (36.6 C)   Ht 5\' 9"  (1.753 m)   Wt 238 lb (108 kg)   BMI 35.15 kg/m  Constitutional: generally well-appearing Psychiatric: alert and oriented x3 Eyes: extraocular movements intact Mouth: oral pharynx moist, no lesions Neck: supple no lymphadenopathy Cardiovascular: heart regular rate and rhythm Lungs: clear to auscultation bilaterally Abdomen: soft, nontender, nondistended, no obvious ascites, no peritoneal signs, normal bowel sounds Extremities: no lower extremity edema bilaterally Skin: no lesions on visible extremities   Assessment and plan: 53 y.o. male with GERD without alarm symptoms  He is not taking his antiacid medicines at the correct time in relation to meals.  He is going to stop taking Protonix twice daily and instead he is going to take it all at once, 40 mg about 20 to 30 minutes before his dinner meal which is the only real meal he eats on a daily basis.  He will also start taking Pepcid, famotidine 20 mg pills 1 pill at bedtime every night.  He will not drink any caffeinated beverages after noon or so.  He knows that he should not be laying down for bed within 3 hours of eating or consuming anything caloric.  He will call my office in 4 weeks to report on his response.  He understands if he does respond well to the antiacid regimen that he probably will not need endoscopic treatment given his lack of alarm symptoms    Please see the "Patient Instructions" section for addition details about the plan.   [name], MD [facility] 10/10/2019, 3:56 PM  Cc: Berkley Harvey, NP

## 2020-02-21 ENCOUNTER — Other Ambulatory Visit: Payer: Self-pay

## 2020-02-21 ENCOUNTER — Telehealth: Payer: Self-pay | Admitting: Oncology

## 2020-02-21 ENCOUNTER — Inpatient Hospital Stay (HOSPITAL_BASED_OUTPATIENT_CLINIC_OR_DEPARTMENT_OTHER): Payer: 59 | Admitting: Oncology

## 2020-02-21 ENCOUNTER — Inpatient Hospital Stay: Payer: 59 | Attending: Oncology

## 2020-02-21 VITALS — BP 126/78 | HR 56 | Temp 98.0°F | Resp 18 | Wt 241.5 lb

## 2020-02-21 DIAGNOSIS — Z9221 Personal history of antineoplastic chemotherapy: Secondary | ICD-10-CM | POA: Insufficient documentation

## 2020-02-21 DIAGNOSIS — Z8547 Personal history of malignant neoplasm of testis: Secondary | ICD-10-CM | POA: Insufficient documentation

## 2020-02-21 DIAGNOSIS — C6292 Malignant neoplasm of left testis, unspecified whether descended or undescended: Secondary | ICD-10-CM

## 2020-02-21 DIAGNOSIS — D649 Anemia, unspecified: Secondary | ICD-10-CM | POA: Insufficient documentation

## 2020-02-21 LAB — CMP (CANCER CENTER ONLY)
ALT: 21 U/L (ref 0–44)
AST: 21 U/L (ref 15–41)
Albumin: 3.9 g/dL (ref 3.5–5.0)
Alkaline Phosphatase: 68 U/L (ref 38–126)
Anion gap: 9 (ref 5–15)
BUN: 19 mg/dL (ref 6–20)
CO2: 21 mmol/L — ABNORMAL LOW (ref 22–32)
Calcium: 9.3 mg/dL (ref 8.9–10.3)
Chloride: 111 mmol/L (ref 98–111)
Creatinine: 1.32 mg/dL — ABNORMAL HIGH (ref 0.61–1.24)
GFR, Est AFR Am: >60 mL/min (ref >60)
GFR, Estimated: >60 mL/min (ref >60)
Glucose, Bld: 94 mg/dL (ref 70–99)
Potassium: 4.4 mmol/L (ref 3.5–5.1)
Sodium: 141 mmol/L (ref 135–145)
Total Bilirubin: 0.3 mg/dL (ref 0.3–1.2)
Total Protein: 6.8 g/dL (ref 6.5–8.1)

## 2020-02-21 LAB — CBC WITH DIFFERENTIAL (CANCER CENTER ONLY)
Abs Immature Granulocytes: 0.01 10*3/uL (ref 0.00–0.07)
Basophils Absolute: 0 10*3/uL (ref 0.0–0.1)
Basophils Relative: 1 %
Eosinophils Absolute: 0.2 10*3/uL (ref 0.0–0.5)
Eosinophils Relative: 3 %
HCT: 35.1 % — ABNORMAL LOW (ref 39.0–52.0)
Hemoglobin: 11.2 g/dL — ABNORMAL LOW (ref 13.0–17.0)
Immature Granulocytes: 0 %
Lymphocytes Relative: 20 %
Lymphs Abs: 1.1 10*3/uL (ref 0.7–4.0)
MCH: 27.9 pg (ref 26.0–34.0)
MCHC: 31.9 g/dL (ref 30.0–36.0)
MCV: 87.3 fL (ref 80.0–100.0)
Monocytes Absolute: 0.5 10*3/uL (ref 0.1–1.0)
Monocytes Relative: 8 %
Neutro Abs: 3.7 10*3/uL (ref 1.7–7.7)
Neutrophils Relative %: 68 %
Platelet Count: 183 10*3/uL (ref 150–400)
RBC: 4.02 MIL/uL — ABNORMAL LOW (ref 4.22–5.81)
RDW: 14.2 % (ref 11.5–15.5)
WBC Count: 5.5 10*3/uL (ref 4.0–10.5)
nRBC: 0 % (ref 0.0–0.2)

## 2020-02-21 LAB — LACTATE DEHYDROGENASE: LDH: 205 U/L — ABNORMAL HIGH (ref 98–192)

## 2020-02-21 NOTE — Telephone Encounter (Signed)
Scheduled appt per 4/14 los.  Printed and mailed appt calendar.

## 2020-02-21 NOTE — Progress Notes (Signed)
Hematology and Oncology Follow Up   Roberto Ruiz [id number] Jan 01, 1966 54 y.o. 02/21/2020 8:03 AM Roberto Ruiz, NPJones, Roberto Raring, NP     Principle Diagnosis: 54 year old with testicular cancer presented with stage IB T2N0 nonseminoma in 2015.  He was found to have 60% seminoma and 40% embryonal.   Prior Therapy: He is status post left orchiectomy done on 11/13/2013.  Followed by adjuvant chemotherapy in the form of 2 cycles of BEP completed 01/30/2014  Current therapy: Active surveillance.  Interim History: Roberto Ruiz is here for repeat evaluation.  Since the last visit, he reports no major changes in his health.  He denies any recent hospitalizations or illnesses.  Denies any worsening neuropathy but does report occasional tinnitus conclusion of chemotherapy.  Continues to eat well travel regularly for his job without any decline in ability to do so.  He donated blood to the [employer] the last few days.    Medications: Updated on review. Current Outpatient Medications  Medication Sig Dispense Refill  . cyclobenzaprine (FLEXERIL) 5 MG tablet Take by mouth.    . Multiple Vitamin (MULTIVITAMIN) tablet Take 1 tablet by mouth daily.    . pantoprazole (PROTONIX) 20 MG tablet Take by mouth.     No current facility-administered medications for this visit.     Allergies:  Allergies  Allergen Reactions  . Compazine [Prochlorperazine Edisylate] Other (See Comments)    Caused severe shaking and leg cramps Caused sever shaking and leg cramps Caused severe shaking and leg cramps Caused sever shaking and leg cramps   . Sulfa Antibiotics Hives and Other (See Comments)  . Sulfasalazine Hives     Physical exam: Blood pressure 126/78, pulse (!) 56, temperature 98 F (36.7 C), temperature source Temporal, resp. rate 18, weight 241 lb 8 oz (109.5 kg), SpO2 100 %.   ECOG 0  General appearance: Comfortable appearing without any discomfort Head: Normocephalic without any  trauma Oropharynx: Mucous membranes are moist and pink without any thrush or ulcers. Eyes: Pupils are equal and round reactive to light. Lymph nodes: No cervical, supraclavicular, inguinal or axillary lymphadenopathy.   Heart:regular rate and rhythm.  S1 and S2 without leg edema. Lung: Clear without any rhonchi or wheezes.  No dullness to percussion. Abdomin: Soft, nontender, nondistended with good bowel sounds.  No hepatosplenomegaly. Musculoskeletal: No joint deformity or effusion.  Full range of motion noted. Neurological: No deficits noted on motor, sensory and deep tendon reflex exam. Skin: No petechial rash or dryness.  Appeared moist.        Lab Results: Lab Results  Component Value Date   WBC 5.9 02/14/2019   HGB 14.5 02/14/2019   HCT 45.2 02/14/2019   MCV 90.2 02/14/2019   PLT 180 02/14/2019     Chemistry      Component Value Date/Time   NA 141 02/14/2019 1108   NA 141 08/24/2017 0806   K 4.3 02/14/2019 1108   K 4.3 08/24/2017 0806   CL 108 02/14/2019 1108   CO2 24 02/14/2019 1108   CO2 21 (L) 08/24/2017 0806   BUN 16 02/14/2019 1108   BUN 21.0 08/24/2017 0806   CREATININE 1.26 (H) 02/14/2019 1108   CREATININE 1.3 08/24/2017 0806      Component Value Date/Time   CALCIUM 9.3 02/14/2019 1108   CALCIUM 9.4 08/24/2017 0806   ALKPHOS 72 02/14/2019 1108   ALKPHOS 61 08/24/2017 0806   AST 20 02/14/2019 1108   AST 19 08/24/2017 0806   ALT 20  02/14/2019 1108   ALT 18 08/24/2017 0806   BILITOT 0.7 02/14/2019 1108   BILITOT 0.63 08/24/2017 0806       Impression and Plan:  54 year old man:   1.    Left testicular cancer diagnosed in 2015.  He was found to have T2N0 stage IB   with 40% embryonal as well as 60% seminoma.  He is currently on active surveillance without any evidence of recurrent disease.  Imaging studies from April 2020 showed no metastatic disease at that time.  Tumor markers were also reviewed including other laboratory data showed no  evidence to suggest metastatic disease.  The natural course of disease and risk of relapse was assessed at this time.  He is or 5 years from the completion of his treatment.  Risks and benefits of continuing active surveillance at this time was discussed.  For the time being we will continue annual follow-up and potentially suspend surveillance after 12 months.  2. Age-appropriate cancer screening: He is up-to-date without any issues.  Last colonoscopy was done in 2019.  3.  Mild anemia: This is in the setting of a recent blood donation.  Prior to his donation his hemoglobin was over 12  4. Followup: In 1 year for repeat evaluation.  30  minutes were dedicated to this visit. The time was spent on reviewing laboratory data, imaging studies, discussing treatment options, discussing differential diagnosis and answering questions regarding future plan.   Zola Button, MD 02/21/2020 8:03 AM

## 2020-02-22 LAB — BETA HCG QUANT (REF LAB): hCG Quant: <1 m[IU]/mL (ref 0–3)

## 2020-02-22 LAB — AFP TUMOR MARKER: AFP, Serum, Tumor Marker: 3.2 ng/mL (ref 0.0–8.3)

## 2020-04-25 ENCOUNTER — Ambulatory Visit: Payer: Managed Care, Other (non HMO) | Admitting: Neurology

## 2020-06-20 DIAGNOSIS — S0992XA Unspecified injury of nose, initial encounter: Secondary | ICD-10-CM | POA: Insufficient documentation

## 2020-09-03 ENCOUNTER — Ambulatory Visit (INDEPENDENT_AMBULATORY_CARE_PROVIDER_SITE_OTHER): Payer: 59 | Admitting: Podiatry

## 2020-09-03 ENCOUNTER — Encounter: Payer: Self-pay | Admitting: Podiatry

## 2020-09-03 ENCOUNTER — Other Ambulatory Visit: Payer: Self-pay

## 2020-09-03 ENCOUNTER — Ambulatory Visit (INDEPENDENT_AMBULATORY_CARE_PROVIDER_SITE_OTHER): Payer: 59

## 2020-09-03 DIAGNOSIS — M7671 Peroneal tendinitis, right leg: Secondary | ICD-10-CM

## 2020-09-03 MED ORDER — MELOXICAM 15 MG PO TABS: 15.0000 mg | ORAL_TABLET | Freq: Every day | ORAL | 3 refills | Status: DC

## 2020-09-03 MED ORDER — METHYLPREDNISOLONE 4 MG PO TBPK: ORAL_TABLET | ORAL | 0 refills | Status: DC

## 2020-09-03 NOTE — Progress Notes (Signed)
Subjective:  Patient ID: Roberto Ruiz, male    DOB: January 13, 1966,  MRN: [id number] HPI Chief Complaint  Patient presents with  . Ankle Pain    Lateral ankle right - aching x 2 months, swelling a lot by the end of the day, pulling sensation, tried advil/tylenol    54 y.o. male presents with the above complaint.   ROS: Denies fever chills nausea vomiting muscle aches pains calf pain back pain chest pain shortness of breath.  Past Medical History:  Diagnosis Date  . Cancer [facility]) 2013   testicular  . GERD (gastroesophageal reflux disease)    with Chemo  . Wears contact lenses    Past Surgical History:  Procedure Laterality Date  . ORCHIECTOMY Left 11/13/2013   Procedure: LEFT RADICAL INGUINAL ORCHIECTOMY;  Surgeon: Molli Hazard, MD;  Location: [facility];  Service: Urology;  Laterality: Left;  . right arthroscopy w/ open removal of reputured bursa  1987  . TESTICLE REMOVAL  2013  . TONSILLECTOMY AND ADENOIDECTOMY  1990    Current Outpatient Medications:  .  meloxicam (MOBIC) 15 MG tablet, Take 1 tablet (15 mg total) by mouth daily., Disp: 30 tablet, Rfl: 3 .  methylPREDNISolone (MEDROL DOSEPAK) 4 MG TBPK tablet, 6 day dose pack - take as directed, Disp: 21 tablet, Rfl: 0 .  Multiple Vitamin (MULTIVITAMIN) tablet, Take 1 tablet by mouth daily., Disp: , Rfl:   Allergies  Allergen Reactions  . Compazine [Prochlorperazine Edisylate] Other (See Comments)    Caused severe shaking and leg cramps Caused sever shaking and leg cramps Caused severe shaking and leg cramps Caused sever shaking and leg cramps   . Sulfa Antibiotics Hives and Other (See Comments)  . Sulfasalazine Hives   Review of Systems Objective:  There were no vitals filed for this visit.  General: Well developed, nourished, in no acute distress, alert and oriented x3   Dermatological: Skin is warm, dry and supple bilateral. Nails x 10 are well maintained; remaining integument appears  unremarkable at this time. There are no open sores, no preulcerative lesions, no rash or signs of infection present.  Vascular: Dorsalis Pedis artery and Posterior Tibial artery pedal pulses are 2/4 bilateral with immedate capillary fill time. Pedal hair growth present. No varicosities and no lower extremity edema present bilateral.   Neruologic: Grossly intact via light touch bilateral. Vibratory intact via tuning fork bilateral. Protective threshold with Semmes Wienstein monofilament intact to all pedal sites bilateral. Patellar and Achilles deep tendon reflexes 2+ bilateral. No Babinski or clonus noted bilateral.   Musculoskeletal: No gross boney pedal deformities bilateral. No pain, crepitus, or limitation noted with foot and ankle range of motion bilateral. Muscular strength 5/5 in all groups tested bilateral.  Fluctuance and tenderness along palpation of the peroneal tendons of the right foot where previous surgery was performed.  He has plantarflexion and eversion and abduction against resistance which is normal.  There is no erythema it is mild edema.  He has some tenderness on palpation of the cages triangle.  Gait: Unassisted, Nonantalgic.    Radiographs:  Radiographs taken today demonstrate osseously mature individual right foot no significant osseous abnormalities.  No acute findings.  Assessment & Plan:   Assessment: Peroneal tendinitis right  Plan: I injected the area today with 10 mg of Kenalog 5 mg Marcaine point of maximal tenderness into the tendon sheath.  This was inframalleolar.  Placed in a compression anklet and discussed the use of his Tri-Lock brace.  He  is about to leave for a trip to [place] and I recommended that he take the brace with him.  I started him on a Medrol Dosepak to be followed by meloxicam.  Also talked him about icing every day after he was done with his 2 hours.  I will follow-up with him once he returns from [place].     Truda Staub T. [place], [place]

## 2020-10-10 ENCOUNTER — Other Ambulatory Visit: Payer: Self-pay

## 2020-10-10 ENCOUNTER — Encounter: Payer: Self-pay | Admitting: Podiatry

## 2020-10-10 ENCOUNTER — Ambulatory Visit (INDEPENDENT_AMBULATORY_CARE_PROVIDER_SITE_OTHER): Payer: 59 | Admitting: Podiatry

## 2020-10-10 DIAGNOSIS — M7671 Peroneal tendinitis, right leg: Secondary | ICD-10-CM | POA: Diagnosis not present

## 2020-10-10 DIAGNOSIS — L603 Nail dystrophy: Secondary | ICD-10-CM | POA: Diagnosis not present

## 2020-10-10 MED ORDER — DEXAMETHASONE SODIUM PHOSPHATE 120 MG/30ML IJ SOLN
2.0000 mg | Freq: Once | INTRAMUSCULAR | Status: AC
  Administered 2020-10-10: 2 mg via INTRA_ARTICULAR

## 2020-10-12 NOTE — Progress Notes (Signed)
He presents today after returning from [place] for follow-up of his peroneal tendinitis of his right foot.  He states is a little better probably 60 to 70%.  He states that it was a bit sore while he was in [place] but he was able to do well.  He is also concerned about a thickened nail hallux left.  Is been there for quite some time his wife is concerned more than he is.  Objective: Vital signs are stable he is alert oriented x3.  Pulses are palpable.  He still has some tenderness on palpation of the: Peroneal tendon repair site right.  He has good abduction against resistance and plantarflexion and abduction against resistance.  Seems to have improved a lot.  Nail dystrophy just to the distal margin of the hallux left.  There appears to be some dry subungual debris.  Assessment: Slowly resolving peroneal tendinitis 60 to 70% right.  Nail dystrophy hallux left.  Plan: Injected 10 mg of dexamethasone to the point of maximal tenderness making sure not to inject into the peroneal tendons.  Took a sample of the skin and nail of the hallux left to be sent for pathologic evaluation we will follow-up with him in 1 month otherwise he will continue all other conservative therapies regarding the peroneal tendinitis.

## 2020-11-14 ENCOUNTER — Ambulatory Visit (INDEPENDENT_AMBULATORY_CARE_PROVIDER_SITE_OTHER): Payer: 59 | Admitting: Podiatry

## 2020-11-14 ENCOUNTER — Other Ambulatory Visit: Payer: Self-pay

## 2020-11-14 DIAGNOSIS — Z79899 Other long term (current) drug therapy: Secondary | ICD-10-CM

## 2020-11-14 MED ORDER — TERBINAFINE HCL 250 MG PO TABS: 250.0000 mg | ORAL_TABLET | Freq: Every day | ORAL | 0 refills | Status: DC

## 2020-11-14 MED ORDER — METHYLPREDNISOLONE 4 MG PO TBPK: ORAL_TABLET | ORAL | 0 refills | Status: DC

## 2020-11-14 NOTE — Progress Notes (Signed)
He presents today for follow-up of his peroneal tendinitis he states that is doing much better start to get a little bit of pain to the posterior aspect of the right heel a little more toward the Achilles.  But all in all he states that he still good 80 to 85% improved.  He is also here today for his pathology regarding his toenail.  Objective: Vital signs are stable he is alert oriented x3 minimal reproducible pain on palpation of the right ankle and heel.  It appears to be more of a deep peroneal tendons rather than the Achilles on palpation.  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  Assessment: Onychomycosis and slowly healing peroneal tendinitis.  Plan: Discussed etiology pathology conservative surgical therapies at this point time we are going to go ahead and run him on a Medrol Dosepak and start him on Lamisil as well.  I will give him his first 30 days of Lamisil 1 tablet daily and I will request a liver profile.  Remember to ask how his trip to [place] went for [employer].

## 2020-11-15 LAB — COMPLETE METABOLIC PANEL WITH GFR
AG Ratio: 1.6 (calc) (ref 1.0–2.5)
ALT: 25 U/L (ref 9–46)
AST: 22 U/L (ref 10–35)
Albumin: 4.6 g/dL (ref 3.6–5.1)
Alkaline phosphatase (APISO): 65 U/L (ref 35–144)
BUN: 23 mg/dL (ref 7–25)
CO2: 26 mmol/L (ref 20–32)
Calcium: 9.7 mg/dL (ref 8.6–10.3)
Chloride: 107 mmol/L (ref 98–110)
Creat: 1.33 mg/dL (ref 0.70–1.33)
GFR, Est African American: 71 mL/min/{1.73_m2} (ref >=60)
GFR, Est Non African American: 61 mL/min/{1.73_m2} (ref >=60)
Globulin: 2.8 g/dL (calc) (ref 1.9–3.7)
Glucose, Bld: 86 mg/dL (ref 65–139)
Potassium: 4.1 mmol/L (ref 3.5–5.3)
Sodium: 140 mmol/L (ref 135–146)
Total Bilirubin: 0.6 mg/dL (ref 0.2–1.2)
Total Protein: 7.4 g/dL (ref 6.1–8.1)

## 2020-12-12 ENCOUNTER — Ambulatory Visit: Payer: 59 | Admitting: Podiatry

## 2020-12-16 ENCOUNTER — Telehealth: Payer: Self-pay | Admitting: Podiatry

## 2020-12-16 NOTE — Telephone Encounter (Signed)
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 Wait for his reply on this. It seems like from his last note. Patient was supposed to get only a 30 day supply and then after more blood work. Thanks Dr. Cannon Kettle

## 2020-12-16 NOTE — Telephone Encounter (Signed)
Pt is requesting a refill on LAMISIL 250 mg. Please advise.

## 2020-12-17 NOTE — Telephone Encounter (Signed)
Patient needs an appt prior to refill.

## 2020-12-17 NOTE — Telephone Encounter (Signed)
Pt is requesting a refill on LAMISIL 250 mg. Please advise.  He stated he took his last pill yesterday and stated that Dr. Milinda Pointer told him that it was supposed to be taking continuously. He also wanted to know does he need to get blood work.

## 2020-12-17 NOTE — Telephone Encounter (Signed)
Patient is scheduled 02/15

## 2020-12-17 NOTE — Telephone Encounter (Signed)
Please take care of this.  I think this was his first dose.  He will have to come in and we will send him for blood work at that time.

## 2020-12-24 ENCOUNTER — Ambulatory Visit: Payer: 59 | Admitting: Podiatry

## 2021-01-01 ENCOUNTER — Other Ambulatory Visit: Payer: Self-pay

## 2021-01-01 ENCOUNTER — Ambulatory Visit (INDEPENDENT_AMBULATORY_CARE_PROVIDER_SITE_OTHER): Payer: 59 | Admitting: Podiatry

## 2021-01-01 ENCOUNTER — Encounter: Payer: Self-pay | Admitting: Podiatry

## 2021-01-01 DIAGNOSIS — L603 Nail dystrophy: Secondary | ICD-10-CM

## 2021-01-01 DIAGNOSIS — Z79899 Other long term (current) drug therapy: Secondary | ICD-10-CM

## 2021-01-01 DIAGNOSIS — M7671 Peroneal tendinitis, right leg: Secondary | ICD-10-CM

## 2021-01-01 MED ORDER — TERBINAFINE HCL 250 MG PO TABS: 250.0000 mg | ORAL_TABLET | Freq: Every day | ORAL | 0 refills | Status: DC

## 2021-01-01 NOTE — Progress Notes (Signed)
He presents today for follow-up of his fungal nails he is taken 30 days of the Lamisil denies fever chills nausea vomiting muscle aches pains calf pain back pain chest pain shortness of breath itching or rashes.  States that he did not even know he was taken the medicine other than the fact that his skin and nails are clearing.  Objective: Vital signs are stable he is alert oriented x3 currently has mild tenderness on palpation of the peroneal tendons right but otherwise they are doing really well he says.  Toenails appear to be growing out by about 50%.  Assessment: Well-healing onychomycosis.  Tendinitis proximal posterior lateral malleolus.  Plan: Discussed etiology pathology and surgical therapies at this point requested blood work today and we sent him over to have that drawn.  I went ahead and wrote another prescription for his next 90 pills and will follow up with him in 4 months

## 2021-01-02 ENCOUNTER — Telehealth: Payer: Self-pay | Admitting: *Deleted

## 2021-01-02 ENCOUNTER — Telehealth: Payer: Self-pay | Admitting: Podiatry

## 2021-01-02 LAB — COMPREHENSIVE METABOLIC PANEL
ALT: 57 IU/L — ABNORMAL HIGH (ref 0–44)
AST: 136 IU/L — ABNORMAL HIGH (ref 0–40)
Albumin/Globulin Ratio: 2 (ref 1.2–2.2)
Albumin: 5 g/dL — ABNORMAL HIGH (ref 3.8–4.9)
Alkaline Phosphatase: 81 IU/L (ref 44–121)
BUN/Creatinine Ratio: 10 (ref 9–20)
BUN: 16 mg/dL (ref 6–24)
Bilirubin Total: 0.6 mg/dL (ref 0.0–1.2)
CO2: 19 mmol/L — ABNORMAL LOW (ref 20–29)
Calcium: 9.7 mg/dL (ref 8.7–10.2)
Chloride: 101 mmol/L (ref 96–106)
Creatinine, Ser: 1.6 mg/dL — ABNORMAL HIGH (ref 0.76–1.27)
GFR calc Af Amer: 56 mL/min/{1.73_m2} — ABNORMAL LOW (ref >59)
GFR calc non Af Amer: 49 mL/min/{1.73_m2} — ABNORMAL LOW (ref >59)
Globulin, Total: 2.5 g/dL (ref 1.5–4.5)
Glucose: 89 mg/dL (ref 65–99)
Potassium: 4.2 mmol/L (ref 3.5–5.2)
Sodium: 140 mmol/L (ref 134–144)
Total Protein: 7.5 g/dL (ref 6.0–8.5)

## 2021-01-02 NOTE — Telephone Encounter (Signed)
-----   Message from Garrel Ridgel, [place] sent at 01/02/2021  6:54 AM EST ----- His liver enzymes have jumped up quite a bit.  Have him stop the meds for two weeks then restart at qod.  Follow up in a month from now and we will test again.

## 2021-01-02 NOTE — Telephone Encounter (Signed)
Called patient - L/M  Informed patient liver enzymes were elevated and to discontinue the medication for 2 weeks, then restart medication only taking every other day. Advised will need more bloodwork to restart medication at normal dosing if labs were normal.

## 2021-01-07 NOTE — Telephone Encounter (Signed)
Scheduled for 04/06 due to Dr. Milinda Pointer being out the week of the 21st and the patient being out the week of the 28th.

## 2021-01-07 NOTE — Telephone Encounter (Signed)
Thank you :)

## 2021-02-12 ENCOUNTER — Ambulatory Visit (INDEPENDENT_AMBULATORY_CARE_PROVIDER_SITE_OTHER): Payer: 59 | Admitting: Podiatry

## 2021-02-12 ENCOUNTER — Other Ambulatory Visit: Payer: Self-pay

## 2021-02-12 ENCOUNTER — Encounter: Payer: Self-pay | Admitting: Podiatry

## 2021-02-12 DIAGNOSIS — L603 Nail dystrophy: Secondary | ICD-10-CM | POA: Diagnosis not present

## 2021-02-12 DIAGNOSIS — Z79899 Other long term (current) drug therapy: Secondary | ICD-10-CM

## 2021-02-12 NOTE — Progress Notes (Signed)
He presents today for follow-up of his nail fungus.  He denies any fever chills nausea vomiting muscle aches pains calf pain back pain chest pain shortness of breath states that he has been taking his pills for the last 2 weeks 1 tablet every other day he is here today were going to recheck his blood work since his liver profile was high last visit.  He is also complaining of a painful right heel.  States that he stepped on a rock and was exquisitely painful.  Objective: Vital signs are stable he is alert oriented x3 no change in the nail plates yet.  He does have a what appears to be a foreign object in his heel there is a area about 2 mm in diameter that has blood underneath the skin and it is circumferential.  I debrided this today after a alcohol skin wipe I was able to retrieve a 1 cm long thorn from the heel.  There was a small amount of pus present there is no erythema to suggest spreading spreading of the infection  Assessment foreign object right heel.  Long-term therapy for onychomycosis.  Plan: Remove foreign body today.  Requested a CMP and recommend he continue every other day until we get the blood work back.

## 2021-02-13 LAB — COMPREHENSIVE METABOLIC PANEL
ALT: 26 IU/L (ref 0–44)
AST: 27 IU/L (ref 0–40)
Albumin/Globulin Ratio: 2.1 (ref 1.2–2.2)
Albumin: 4.8 g/dL (ref 3.8–4.9)
Alkaline Phosphatase: 80 IU/L (ref 44–121)
BUN/Creatinine Ratio: 14 (ref 9–20)
BUN: 18 mg/dL (ref 6–24)
Bilirubin Total: 0.5 mg/dL (ref 0.0–1.2)
CO2: 20 mmol/L (ref 20–29)
Calcium: 9.8 mg/dL (ref 8.7–10.2)
Chloride: 102 mmol/L (ref 96–106)
Creatinine, Ser: 1.31 mg/dL — ABNORMAL HIGH (ref 0.76–1.27)
Globulin, Total: 2.3 g/dL (ref 1.5–4.5)
Glucose: 75 mg/dL (ref 65–99)
Potassium: 4.5 mmol/L (ref 3.5–5.2)
Sodium: 139 mmol/L (ref 134–144)
Total Protein: 7.1 g/dL (ref 6.0–8.5)
eGFR: 65 mL/min/{1.73_m2} (ref >59)

## 2021-02-18 ENCOUNTER — Other Ambulatory Visit: Payer: Self-pay | Admitting: Podiatry

## 2021-02-19 ENCOUNTER — Inpatient Hospital Stay: Payer: 59

## 2021-02-19 ENCOUNTER — Other Ambulatory Visit: Payer: Self-pay

## 2021-02-19 ENCOUNTER — Inpatient Hospital Stay: Payer: 59 | Attending: Oncology | Admitting: Oncology

## 2021-02-19 VITALS — BP 117/83 | HR 61 | Temp 98.4°F | Resp 18 | Wt 240.3 lb

## 2021-02-19 DIAGNOSIS — C6292 Malignant neoplasm of left testis, unspecified whether descended or undescended: Secondary | ICD-10-CM

## 2021-02-19 DIAGNOSIS — Z8547 Personal history of malignant neoplasm of testis: Secondary | ICD-10-CM | POA: Diagnosis present

## 2021-02-19 DIAGNOSIS — Z9221 Personal history of antineoplastic chemotherapy: Secondary | ICD-10-CM | POA: Insufficient documentation

## 2021-02-19 LAB — CMP (CANCER CENTER ONLY)
ALT: 23 U/L (ref 0–44)
AST: 23 U/L (ref 15–41)
Albumin: 4.3 g/dL (ref 3.5–5.0)
Alkaline Phosphatase: 67 U/L (ref 38–126)
Anion gap: 11 (ref 5–15)
BUN: 25 mg/dL — ABNORMAL HIGH (ref 6–20)
CO2: 24 mmol/L (ref 22–32)
Calcium: 9.4 mg/dL (ref 8.9–10.3)
Chloride: 109 mmol/L (ref 98–111)
Creatinine: 1.56 mg/dL — ABNORMAL HIGH (ref 0.61–1.24)
GFR, Estimated: 53 mL/min — ABNORMAL LOW (ref >60)
Glucose, Bld: 89 mg/dL (ref 70–99)
Potassium: 4.6 mmol/L (ref 3.5–5.1)
Sodium: 144 mmol/L (ref 135–145)
Total Bilirubin: 0.5 mg/dL (ref 0.3–1.2)
Total Protein: 7.3 g/dL (ref 6.5–8.1)

## 2021-02-19 LAB — CBC WITH DIFFERENTIAL (CANCER CENTER ONLY)
Abs Immature Granulocytes: 0.01 10*3/uL (ref 0.00–0.07)
Basophils Absolute: 0 10*3/uL (ref 0.0–0.1)
Basophils Relative: 1 %
Eosinophils Absolute: 0.1 10*3/uL (ref 0.0–0.5)
Eosinophils Relative: 2 %
HCT: 41.7 % (ref 39.0–52.0)
Hemoglobin: 13.9 g/dL (ref 13.0–17.0)
Immature Granulocytes: 0 %
Lymphocytes Relative: 27 %
Lymphs Abs: 1.6 10*3/uL (ref 0.7–4.0)
MCH: 29 pg (ref 26.0–34.0)
MCHC: 33.3 g/dL (ref 30.0–36.0)
MCV: 86.9 fL (ref 80.0–100.0)
Monocytes Absolute: 0.5 10*3/uL (ref 0.1–1.0)
Monocytes Relative: 8 %
Neutro Abs: 3.6 10*3/uL (ref 1.7–7.7)
Neutrophils Relative %: 62 %
Platelet Count: 170 10*3/uL (ref 150–400)
RBC: 4.8 MIL/uL (ref 4.22–5.81)
RDW: 13.2 % (ref 11.5–15.5)
WBC Count: 5.8 10*3/uL (ref 4.0–10.5)
nRBC: 0 % (ref 0.0–0.2)

## 2021-02-19 LAB — LACTATE DEHYDROGENASE: LDH: 205 U/L — ABNORMAL HIGH (ref 98–192)

## 2021-02-19 NOTE — Progress Notes (Signed)
Hematology and Oncology Follow Up   Roberto Ruiz [id number] 1966-09-13 55 y.o. 02/19/2021 8:41 AM Berkley Harvey, NPJones, Sherrill Raring, NP     Principle Diagnosis: 55 year old man with stage IB, T2N0 nonseminoma left testicular cancer in 2015.  He was found to have 60% seminoma and 40% embryonal at the time of diagnosis.   Prior Therapy: He is status post left orchiectomy done on 11/13/2013.  Followed by adjuvant chemotherapy in the form of 2 cycles of BEP completed 01/30/2014  Current therapy: Active surveillance.  Interim History: Mr. Schliep is here for a follow-up visit.  Since the last visit, he reports feeling well without any complaints.  He denies any recent hospitalization or illnesses.  He continues to perform activities of daily living without any decline.  Continues to be active and works full-time.  His performance status and quality of life is unchanged.    Medications: Updated on review. Current Outpatient Medications  Medication Sig Dispense Refill  . meloxicam (MOBIC) 15 MG tablet Take 1 tablet (15 mg total) by mouth daily. 30 tablet 3  . Multiple Vitamin (MULTIVITAMIN) tablet Take 1 tablet by mouth daily.    Marland Kitchen terbinafine (LAMISIL) 250 MG tablet Take 1 tablet (250 mg total) by mouth daily. 90 tablet 0   No current facility-administered medications for this visit.     Allergies:  Allergies  Allergen Reactions  . Compazine [Prochlorperazine Edisylate] Other (See Comments)    Caused severe shaking and leg cramps Caused sever shaking and leg cramps Caused severe shaking and leg cramps Caused sever shaking and leg cramps   . Sulfa Antibiotics Hives and Other (See Comments)  . Sulfasalazine Hives     Physical exam: Blood pressure 117/83, pulse 61, temperature 98.4 F (36.9 C), temperature source Oral, resp. rate 18, weight 240 lb 4.8 oz (109 kg), SpO2 100 %.    ECOG 0    General appearance: Alert, awake without any distress. Head: Atraumatic without  abnormalities Oropharynx: Without any thrush or ulcers. Eyes: No scleral icterus. Lymph nodes: No lymphadenopathy noted in the cervical, supraclavicular, or axillary nodes Heart:regular rate and rhythm, without any murmurs or gallops.   Lung: Clear to auscultation without any rhonchi, wheezes or dullness to percussion. Abdomin: Soft, nontender without any shifting dullness or ascites. Musculoskeletal: No clubbing or cyanosis. Neurological: No motor or sensory deficits. Skin: No rashes or lesions.        Lab Results: Lab Results  Component Value Date   WBC 5.5 02/21/2020   HGB 11.2 (L) 02/21/2020   HCT 35.1 (L) 02/21/2020   MCV 87.3 02/21/2020   PLT 183 02/21/2020     Chemistry      Component Value Date/Time   NA 139 02/12/2021 1058   NA 141 08/24/2017 0806   K 4.5 02/12/2021 1058   K 4.3 08/24/2017 0806   CL 102 02/12/2021 1058   CO2 20 02/12/2021 1058   CO2 21 (L) 08/24/2017 0806   BUN 18 02/12/2021 1058   BUN 21.0 08/24/2017 0806   CREATININE 1.31 (H) 02/12/2021 1058   CREATININE 1.33 11/14/2020 1618   CREATININE 1.3 08/24/2017 0806      Component Value Date/Time   CALCIUM 9.8 02/12/2021 1058   CALCIUM 9.4 08/24/2017 0806   ALKPHOS 80 02/12/2021 1058   ALKPHOS 61 08/24/2017 0806   AST 27 02/12/2021 1058   AST 21 02/21/2020 0755   AST 19 08/24/2017 0806   ALT 26 02/12/2021 1058   ALT 21 02/21/2020 0755  ALT 18 08/24/2017 0806   BILITOT 0.5 02/12/2021 1058   BILITOT 0.3 02/21/2020 0755   BILITOT 0.63 08/24/2017 0806       Impression and Plan:  55 year old man:   1.   T2N0, stage IB left testicular cancer diagnosed in 2015.  He presented   with 40% embryonal as well as 60% seminoma.  His disease status was updated at this time at risk of relapse discussed.  He is over 5 years out from his diagnosis approaching 7 years after orchiectomy and adjuvant chemotherapy.  Risk of relapse remains very low at this time and no further imaging studies or  oncology follow-up is required.  If he developed any symptoms in the future we will address that with repeat imaging studies.  2. Age-appropriate cancer screening: He is up-to-date clinic colonoscopy and other age-appropriate cancer screening.  3.  Mild anemia: His hemoglobin is back to normal without any need for intervention.  4. Followup: No formal follow-up is needed.  I will be happy to see him in the future as needed.   30  minutes were spent on this encounter.  The time was dedicated to reviewing laboratory data, disease status update and discussing the risk of relapse for the future.   Zola Button, MD 02/19/2021 8:41 AM

## 2021-02-20 LAB — AFP TUMOR MARKER: AFP, Serum, Tumor Marker: 4.7 ng/mL (ref 0.0–8.4)

## 2021-02-20 LAB — BETA HCG QUANT (REF LAB): hCG Quant: <1 m[IU]/mL (ref 0–3)

## 2021-02-24 ENCOUNTER — Encounter: Payer: Self-pay | Admitting: Podiatry

## 2021-02-27 MED ORDER — TERBINAFINE HCL 250 MG PO TABS: 250.0000 mg | ORAL_TABLET | Freq: Every day | ORAL | 0 refills | Status: DC

## 2021-03-06 MED ORDER — CICLOPIROX 8 % EX SOLN: Freq: Every day | CUTANEOUS | 11 refills | Status: DC

## 2021-05-07 ENCOUNTER — Ambulatory Visit: Payer: 59 | Admitting: Podiatry

## 2021-07-04 ENCOUNTER — Emergency Department (HOSPITAL_BASED_OUTPATIENT_CLINIC_OR_DEPARTMENT_OTHER): Payer: 59

## 2021-07-04 ENCOUNTER — Other Ambulatory Visit: Payer: Self-pay

## 2021-07-04 ENCOUNTER — Encounter (HOSPITAL_BASED_OUTPATIENT_CLINIC_OR_DEPARTMENT_OTHER): Payer: Self-pay

## 2021-07-04 ENCOUNTER — Emergency Department (HOSPITAL_BASED_OUTPATIENT_CLINIC_OR_DEPARTMENT_OTHER)
Admission: EM | Admit: 2021-07-04 | Discharge: 2021-07-05 | Disposition: A | Discharge status: Home / Self Care | Payer: 59 | Attending: Emergency Medicine | Admitting: Emergency Medicine

## 2021-07-04 DIAGNOSIS — K5792 Diverticulitis of intestine, part unspecified, without perforation or abscess without bleeding: Secondary | ICD-10-CM

## 2021-07-04 DIAGNOSIS — R1032 Left lower quadrant pain: Secondary | ICD-10-CM | POA: Diagnosis not present

## 2021-07-04 DIAGNOSIS — K219 Gastro-esophageal reflux disease without esophagitis: Secondary | ICD-10-CM | POA: Insufficient documentation

## 2021-07-04 DIAGNOSIS — Z8549 Personal history of malignant neoplasm of other male genital organs: Secondary | ICD-10-CM | POA: Diagnosis not present

## 2021-07-04 DIAGNOSIS — R109 Unspecified abdominal pain: Secondary | ICD-10-CM | POA: Diagnosis present

## 2021-07-04 HISTORY — DX: Diverticulitis of intestine, part unspecified, without perforation or abscess without bleeding: K57.92

## 2021-07-04 LAB — CBC
HCT: 42.9 % (ref 39.0–52.0)
Hemoglobin: 14.3 g/dL (ref 13.0–17.0)
MCH: 29.2 pg (ref 26.0–34.0)
MCHC: 33.3 g/dL (ref 30.0–36.0)
MCV: 87.6 fL (ref 80.0–100.0)
Platelets: 188 10*3/uL (ref 150–400)
RBC: 4.9 MIL/uL (ref 4.22–5.81)
RDW: 13.4 % (ref 11.5–15.5)
WBC: 9.1 10*3/uL (ref 4.0–10.5)
nRBC: 0 % (ref 0.0–0.2)

## 2021-07-04 LAB — COMPREHENSIVE METABOLIC PANEL
ALT: 18 U/L (ref 0–44)
AST: 21 U/L (ref 15–41)
Albumin: 4.5 g/dL (ref 3.5–5.0)
Alkaline Phosphatase: 66 U/L (ref 38–126)
Anion gap: 6 (ref 5–15)
BUN: 16 mg/dL (ref 6–20)
CO2: 24 mmol/L (ref 22–32)
Calcium: 9.4 mg/dL (ref 8.9–10.3)
Chloride: 108 mmol/L (ref 98–111)
Creatinine, Ser: 1.35 mg/dL — ABNORMAL HIGH (ref 0.61–1.24)
GFR, Estimated: >60 mL/min (ref >60)
Glucose, Bld: 86 mg/dL (ref 70–99)
Potassium: 4.2 mmol/L (ref 3.5–5.1)
Sodium: 138 mmol/L (ref 135–145)
Total Bilirubin: 0.7 mg/dL (ref 0.3–1.2)
Total Protein: 7.8 g/dL (ref 6.5–8.1)

## 2021-07-04 LAB — URINALYSIS, ROUTINE W REFLEX MICROSCOPIC
Bilirubin Urine: NEGATIVE
Glucose, UA: NEGATIVE mg/dL
Hgb urine dipstick: NEGATIVE
Ketones, ur: NEGATIVE mg/dL
Leukocytes,Ua: NEGATIVE
Nitrite: NEGATIVE
Protein, ur: NEGATIVE mg/dL
Specific Gravity, Urine: 1.005 (ref 1.005–1.030)
pH: 6 (ref 5.0–8.0)

## 2021-07-04 LAB — LIPASE, BLOOD: Lipase: 33 U/L (ref 11–51)

## 2021-07-04 MED ORDER — SODIUM CHLORIDE 0.9 % IV SOLN
2.0000 g | Freq: Once | INTRAVENOUS | Status: AC
  Administered 2021-07-04: 2 g via INTRAVENOUS
  Filled 2021-07-04: qty 20

## 2021-07-04 MED ORDER — AMOXICILLIN-POT CLAVULANATE 875-125 MG PO TABS: 1.0000 | ORAL_TABLET | Freq: Two times a day (BID) | ORAL | 0 refills | Status: AC

## 2021-07-04 MED ORDER — METRONIDAZOLE 500 MG/100ML IV SOLN
500.0000 mg | Freq: Once | INTRAVENOUS | Status: AC
Start: 2021-07-04 — End: 2021-07-04
  Administered 2021-07-04: 500 mg via INTRAVENOUS
  Filled 2021-07-04: qty 100

## 2021-07-04 MED ORDER — MORPHINE SULFATE (PF) 4 MG/ML IV SOLN
4.0000 mg | Freq: Once | INTRAVENOUS | Status: AC
  Administered 2021-07-04: 4 mg via INTRAVENOUS
  Filled 2021-07-04: qty 1

## 2021-07-04 MED ORDER — SODIUM CHLORIDE 0.9 % IV SOLN
INTRAVENOUS | Status: DC | PRN
  Administered 2021-07-04: 500 mL via INTRAVENOUS

## 2021-07-04 MED ORDER — IOHEXOL 350 MG/ML SOLN
100.0000 mL | Freq: Once | INTRAVENOUS | Status: AC | PRN
  Administered 2021-07-04: 100 mL via INTRAVENOUS

## 2021-07-04 MED ORDER — KETOROLAC TROMETHAMINE 15 MG/ML IJ SOLN
15.0000 mg | Freq: Once | INTRAMUSCULAR | Status: AC
  Administered 2021-07-04: 15 mg via INTRAVENOUS
  Filled 2021-07-04: qty 1

## 2021-07-04 NOTE — Discharge Instructions (Addendum)
You were seen in the ER today for your abdominal pain.  You have been diagnosed with diverticulitis.  You were administered your first dose of antibiotic in the emergency department, and you have been prescribed an antibiotic to take in the outpatient setting.  Please take this as prescribed for the entire course.  In regards to your sensation of constipation, you are not experiencing any bowel obstruction or fecal impaction at this time.  You may use Metamucil or MiraLAX as needed to encourage your stool to be soft and easy to pass.  Please follow-up in the outpatient setting with your primary care doctor and return to the ER with any worsening abdominal pain, nausea or vomiting does not stop, or any other new severe symptoms.

## 2021-07-04 NOTE — ED Triage Notes (Signed)
Pt c/o left side abd pain, constipation-states feels like a "diverticulitis attack"-NAD-steady gait

## 2021-07-04 NOTE — ED Notes (Signed)
Discussed case with PA-states she will place order for CT abd

## 2021-07-04 NOTE — ED Provider Notes (Signed)
[facility] Provider Note   CSN: [id number] Arrival date & time: 07/04/21  1757     History Chief Complaint  Patient presents with   Abdominal Pain    Roberto Ruiz is a 55 y.o. male with history of diverticulitis who presents with concern for 3 days of left-sided abdominal pain which he states feels like a "diverticulitis attack.  Endorses single episode of nausea earlier without emesis or diarrhea.  Endorses constipation with no bowel movement x3 days, states he is not passing flatus.  Was seen by his PCP was concerned for possible bowel obstruction and directed him to the emergency department.  He denies fever.  I personally reads patient medical records.  His history of laryngopharyngeal reflux, hyperlipidemia, to 6 years answer and diverticulitis.  He is not on any anticoagulation.  HPI     Past Medical History:  Diagnosis Date   Cancer [facility]) 2013   testicular   Diverticulitis    GERD (gastroesophageal reflux disease)    with Chemo   Wears contact lenses     Patient Active Problem List   Diagnosis Date Noted   Nasal injury 06/20/2020   Laryngopharyngeal reflux (LPR) 10/28/2017   Post-nasal drainage 10/04/2017   BMI 31.0-31.9,adult 10/09/2014   Personal history of testicular cancer 11/23/2013   Hyperlipidemia 10/26/2013    Past Surgical History:  Procedure Laterality Date   HERNIA REPAIR     ORCHIECTOMY Left 11/13/2013   Procedure: LEFT RADICAL INGUINAL ORCHIECTOMY;  Surgeon: Molli Hazard, MD;  Location: [facility];  Service: Urology;  Laterality: Left;   right arthroscopy w/ open removal of reputured bursa  1987   TESTICLE REMOVAL  2013   TONSILLECTOMY AND ADENOIDECTOMY  1990       Family History  Problem Relation Age of Onset   Colon cancer Neg Hx    Colon polyps Neg Hx    Esophageal cancer Neg Hx    Rectal cancer Neg Hx    Stomach cancer Neg Hx     Social History   Tobacco Use   Smoking  status: Never   Smokeless tobacco: Never  Vaping Use   Vaping Use: Never used  Substance Use Topics   Alcohol use: No   Drug use: No    Home Medications Prior to Admission medications   Medication Sig Start Date End Date Taking? Authorizing Provider  ciclopirox (PENLAC) 8 % solution Apply topically at bedtime. Apply to nail/surrounding skin and daily over previous coat. Every 7 days remove with alcohol and continue. 03/06/21   Hyatt, Max T, DPM  meloxicam (MOBIC) 15 MG tablet Take 1 tablet (15 mg total) by mouth daily. 09/03/20   Hyatt, Max T, DPM  Multiple Vitamin (MULTIVITAMIN) tablet Take 1 tablet by mouth daily.    [provider]  terbinafine (LAMISIL) 250 MG tablet Take 1 tablet (250 mg total) by mouth daily. 02/27/21   Hyatt, Max T, DPM    Allergies    Compazine [prochlorperazine edisylate], Sulfa antibiotics, and Sulfasalazine  Review of Systems   Review of Systems  Constitutional:  Positive for appetite change and chills. Negative for activity change, diaphoresis, fatigue and fever.  HENT: Negative.    Respiratory: Negative.    Cardiovascular: Negative.   Gastrointestinal:  Positive for abdominal pain, constipation and nausea. Negative for blood in stool, diarrhea and vomiting.  Genitourinary: Negative.   Musculoskeletal: Negative.   Skin: Negative.   Neurological: Negative.    Physical Exam Updated  Vital Signs BP 128/86 (BP Location: Right Arm)   Pulse (!) 55   Temp 98.2 F (36.8 C) (Oral)   Resp 16   Ht '5\' 8"'$  (1.727 m)   Wt 106.6 kg   SpO2 99%   BMI 35.73 kg/m   Physical Exam Vitals and nursing note reviewed. Exam conducted with a chaperone present.  Constitutional:      Appearance: He is overweight. He is not ill-appearing or toxic-appearing.  HENT:     Head: Normocephalic and atraumatic.     Nose: Nose normal.     Mouth/Throat:     Mouth: Mucous membranes are moist.     Pharynx: Oropharynx is clear. Uvula midline. No oropharyngeal exudate or  posterior oropharyngeal erythema.     Tonsils: No tonsillar exudate.  Eyes:     General: Lids are normal. Vision grossly intact.        Right eye: No discharge.        Left eye: No discharge.     Extraocular Movements: Extraocular movements intact.     Conjunctiva/sclera: Conjunctivae normal.     Pupils: Pupils are equal, round, and reactive to light.  Neck:     Trachea: Trachea and phonation normal.  Cardiovascular:     Rate and Rhythm: Normal rate and regular rhythm.     Pulses: Normal pulses.     Heart sounds: Normal heart sounds. No murmur heard. Pulmonary:     Effort: Pulmonary effort is normal. No tachypnea, bradypnea, accessory muscle usage or respiratory distress.     Breath sounds: Normal breath sounds. No wheezing or rales.  Chest:     Chest wall: No mass, lacerations, deformity, swelling, tenderness, crepitus or edema.  Abdominal:     General: Bowel sounds are normal. There is no distension.     Palpations: Abdomen is soft.     Tenderness: There is abdominal tenderness in the left lower quadrant. There is no guarding or rebound.  Genitourinary:    Rectum: Normal. No tenderness, anal fissure, external hemorrhoid or internal hemorrhoid. Normal anal tone.     Comments: Small amount of soft stool present in the rectal vault without palpable mass, hemorrhoid, anal fissure, or melanotic appearing stool. Musculoskeletal:        General: No deformity.     Cervical back: Normal range of motion and neck supple. No edema, rigidity or crepitus. No pain with movement or muscular tenderness.     Right lower leg: No edema.     Left lower leg: No edema.  Lymphadenopathy:     Cervical: No cervical adenopathy.  Skin:    General: Skin is warm and dry.     Capillary Refill: Capillary refill takes less than 2 seconds.     Findings: No rash.  Neurological:     General: No focal deficit present.     Mental Status: He is alert and oriented to person, place, and time. Mental status is at  baseline.  Psychiatric:        Mood and Affect: Mood normal.    ED Results / Procedures / Treatments   Labs (all labs ordered are listed, but only abnormal results are displayed) Labs Reviewed  COMPREHENSIVE METABOLIC PANEL - Abnormal; Notable for the following components:      Result Value   Creatinine, Ser 1.35 (*)    All other components within normal limits  LIPASE, BLOOD  CBC  URINALYSIS, ROUTINE W REFLEX MICROSCOPIC    EKG None  Radiology CT Abdomen Pelvis  W Contrast  Result Date: 07/04/2021 CLINICAL DATA:  Left lower quadrant abdominal pain. History of diverticulitis. EXAM: CT ABDOMEN AND PELVIS WITH CONTRAST TECHNIQUE: Multidetector CT imaging of the abdomen and pelvis was performed using the standard protocol following bolus administration of intravenous contrast. CONTRAST:  152m OMNIPAQUE IOHEXOL 350 MG/ML SOLN COMPARISON:  CT chest 02/14/2019. FINDINGS: Lower chest: Pulmonary micronodule within the right middle lobe. Otherwise no acute abnormality. Hepatobiliary: The hepatic parenchyma is diffusely hypodense compared to the splenic parenchyma consistent with fatty infiltration. Redemonstration of several subcentimeter hypodensities are too small to characterize. Otherwise no new focal liver abnormality. No gallstones, gallbladder wall thickening, or pericholecystic fluid. No biliary dilatation. Pancreas: No focal lesion. Normal pancreatic contour. No surrounding inflammatory changes. No main pancreatic ductal dilatation. Spleen: Normal in size without focal abnormality. A splenule is noted. Adrenals/Urinary Tract: No adrenal nodule bilaterally. Bilateral kidneys enhance symmetrically. No hydronephrosis. No hydroureter. The urinary bladder is unremarkable. On delayed imaging, there is no urothelial wall thickening and there are no filling defects in the opacified portions of the bilateral collecting systems or ureters. Stomach/Bowel: Stomach is within normal limits. No evidence of  small bowel wall thickening or dilatation. Scattered colonic diverticulosis with associated bowel wall thickening and pericolonic fat stranding along the distal descending colon/proximal sigmoid colon. Number of diverticula throughout the sigmoid and descending colon more prominent compared to the remainder of the bowel. No intramural abscess formation. Appendix appears normal. Vascular/Lymphatic: No abdominal aorta or iliac aneurysm. Mild atherosclerotic plaque of the aorta and its branches. No abdominal, pelvic, or inguinal lymphadenopathy. Reproductive: Prostate is unremarkable. Other: No intraperitoneal free fluid. No intraperitoneal free gas. No organized fluid collection. Musculoskeletal: No abdominal wall hernia or abnormality. Status post right inguinal hernia repair with mesh. No recurrent inguinal hernia identified. No suspicious lytic or blastic osseous lesions. No acute displaced fracture. Multilevel facet arthropathy. IMPRESSION: Colonic diverticulosis with uncomplicated acute diverticulitis of the distal descending/proximal sigmoid colon. No associated bowel perforation or abscess formation identified. Recommend colonoscopy status post treatment and status post complete resolution of inflammatory changes to exclude an underlying lesion. Electronically Signed   By: MIven FinnM.D.   On: 07/04/2021 21:26    Procedures Procedures   Medications Ordered in ED Medications  iohexol (OMNIPAQUE) 350 MG/ML injection 100 mL (100 mLs Intravenous Contrast Given 07/04/21 2110)    ED Course  I have reviewed the triage vital signs and the nursing notes.  Pertinent labs & imaging results that were available during my care of the patient were reviewed by me and considered in my medical decision making (see chart for details).    MDM Rules/Calculators/A&P                         55year old male who presents with concern for left lower belly pain, history of diverticulitis.  Differential diagnosis  includes is not limited to diverticulitis, AAA, colitis, mesenteric ischemia, pyelonephritis, UTI, testicular torsion, appendicitis, AAA, colon cancer, bowel obstruction.  Vital signs are normal on intake.  Cardiopulmonary dam is normal abdominal exam is significant for tenderness palpation of the left lower quadrant.  GU exam without fecal impaction or hemorrhoid.    Basic labs were obtained in triage.  CBC unremarkable, CMP unremarkable, lipase normal, UA is normal.  CT of the abdomen pelvis revealed acute uncomplicated diverticulitis of the distal descending and proximal sigmoid colon without perforation or abscess.  We will proceed with IV antibiotic therapy and analgesia.  Suspect sensation  of constipation and pressure in the rectal area likely secondary to acute inflammation from surrounding diverticulitis.  There is no fecal impaction.  Patient has received IV antibiotic therapy in the emergency department, and is feeling improved after ministration of IV pain medication.  Will prescribe outpatient antibiotics and recommend close GI follow-up after completion of antibiotics.  No further work-up warranted in ED as time.  Jarif voiced understanding of his medical evaluation treatment plan.  Each of his questions was answered to his expressed satisfaction.  This chart was dictated using voice recognition software, Dragon. Despite the best efforts of this provider to proofread and correct errors, errors may still occur which can change documentation meaning.  Final Clinical Impression(s) / ED Diagnoses Final diagnoses:  None    Rx / DC Orders ED Discharge Orders     None        Aura Dials 07/05/21 0101    Gareth Morgan, MD 07/07/21 2204

## 2021-07-04 NOTE — ED Notes (Signed)
Pt states he has hx of diverticulitis and has not had a BM since Wednesday morning   Pt states he has pain in his left upper quadrant area is tender upon palpation  Pt denies N/V  States he feels bloated and distended

## 2021-07-07 DIAGNOSIS — D225 Melanocytic nevi of trunk: Secondary | ICD-10-CM | POA: Insufficient documentation

## 2021-10-01 ENCOUNTER — Encounter: Payer: Self-pay | Admitting: Gastroenterology

## 2021-10-01 ENCOUNTER — Ambulatory Visit (INDEPENDENT_AMBULATORY_CARE_PROVIDER_SITE_OTHER): Payer: 59 | Admitting: Gastroenterology

## 2021-10-01 VITALS — BP 100/74 | HR 76 | Ht 68.0 in | Wt 237.0 lb

## 2021-10-01 DIAGNOSIS — K5792 Diverticulitis of intestine, part unspecified, without perforation or abscess without bleeding: Secondary | ICD-10-CM | POA: Diagnosis not present

## 2021-10-01 NOTE — Progress Notes (Signed)
Review of pertinent gastrointestinal problems: 1. Routine risk for colon cancer. Colonoscopy December 2019 Dr. Ardis Hughs for routine risk screening found diverticulosis in the left colon. The examination was otherwise normal. No polyps or cancers. He was recommended to have a repeat colonoscopy for colon cancer screening in 10 years   HPI: This is a very pleasant 55 year old man who was referred to me by Berkley Harvey, NP  to evaluate diverticulitis.    Last saw him about 2 years ago here in the office for GERD without alarm symptoms.  I recommended he start taking his Protonix 40 mg shortly before his dinner meal as well as famotidine at bedtime every night.  We have not heard from him since.  He is here today for a new problem.   CT scan abdomen pelvis with IV and oral contrast August 4098 "uncomplicated acute diverticulitis of the distal descending/proximal sigmoid colon"  Blood work August 2022 CBC, complete overall profiles were normal except for creatinine 1.35 which is around his usual.  UA was negative,  He was in [place], ate some crab cakes and later that night started having left-sided abdominal pains.  They worsened and within 2 or 3 days he was in an emergency room.  See the work-up above.  They gave him Cipro and Flagyl 7-day course, his symptoms did not improve and so his primary care put him on Augmentin for 10-day course and his symptoms improved.  He really only had left-sided pains.  He never had nausea vomiting.  Maybe a mild low-grade temperature.  He tells me this is his fourth or fifth episode of diverticulitis.  He did have CAT scan to document disease 6 or 7 years ago from what he recalls, this is in the [facility] system and I cannot see any of the CAT scans from their facility dated back that far.  His other episodes except for this most recent 1 were empirically diagnosed and improved with antibiotics.  He has no issues with his bowels in between.  No family history  of colon cancer.  He is intentionally losing weight.  He is still bothered by nighttime GERD.  He says this is worse if he eats late.    Review of systems: Pertinent positive and negative review of systems were noted in the above HPI section. All other review negative.   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   Diverticulitis    GERD (gastroesophageal reflux disease)    with Chemo   Wears contact lenses     Past Surgical History:  Procedure Laterality Date   HERNIA REPAIR     ORCHIECTOMY Left 11/13/2013   Procedure: LEFT RADICAL INGUINAL ORCHIECTOMY;  Surgeon: Molli Hazard, MD;  Location: [facility];  Service: Urology;  Laterality: Left;   right arthroscopy w/ open removal of reputured bursa  1987   TESTICLE REMOVAL  2013   TONSILLECTOMY AND ADENOIDECTOMY  1990    Current Outpatient Medications  Medication Sig Dispense Refill   ciclopirox (PENLAC) 8 % solution Apply topically at bedtime. Apply to nail/surrounding skin and daily over previous coat. Every 7 days remove with alcohol and continue. 6.6 mL 11   meloxicam (MOBIC) 15 MG tablet Take 1 tablet (15 mg total) by mouth daily. 30 tablet 3   Multiple Vitamin (MULTIVITAMIN) tablet Take 1 tablet by mouth daily.     No current facility-administered medications for this visit.    Allergies as  of 10/01/2021 - Review Complete 10/01/2021  Allergen Reaction Noted   Compazine [prochlorperazine edisylate] Other (See Comments) 01/23/2014   Sulfa antibiotics Hives and Other (See Comments) 11/13/2013   Sulfasalazine Hives 11/13/2013    Family History  Problem Relation Age of Onset   Colon cancer Neg Hx    Colon polyps Neg Hx    Esophageal cancer Neg Hx    Rectal cancer Neg Hx    Stomach cancer Neg Hx     Social History   Socioeconomic History   Marital status: Married    Spouse name: Not on file   Number of children: Not on file   Years of education: Not on file    Highest education level: Not on file  Occupational History   Not on file  Tobacco Use   Smoking status: Never   Smokeless tobacco: Never  Vaping Use   Vaping Use: Never used  Substance and Sexual Activity   Alcohol use: No   Drug use: No   Sexual activity: Not on file  Other Topics Concern   Not on file  Social History Narrative   Not on file   Social Determinants of Health   Financial Resource Strain: Not on file  Food Insecurity: Not on file  Transportation Needs: Not on file  Physical Activity: Not on file  Stress: Not on file  Social Connections: Not on file  Intimate Partner Violence: Not on file     Physical Exam: BP 100/74   Pulse 76   Ht 5\' 8"  (1.727 m)   Wt 237 lb (107.5 kg)   BMI 36.04 kg/m  Constitutional: generally well-appearing Psychiatric: alert and oriented x3 Eyes: extraocular movements intact Mouth: oral pharynx moist, no lesions Neck: supple no lymphadenopathy Cardiovascular: heart regular rate and rhythm Lungs: clear to auscultation bilaterally Abdomen: soft, nontender, nondistended, no obvious ascites, no peritoneal signs, normal bowel sounds Extremities: no lower extremity edema bilaterally Skin: no lesions on visible extremities   Assessment and plan: 55 y.o. male with recurrent left-sided diverticulitis, GERD without alarm symptoms  We had a nice discussion about diverticulitis.  I am going to refer her to a general surgeon here in town to discuss, consider elective sigmoid resection given his recurrent disease.  I recommended we repeat colonoscopy for him to make sure we are not missing something mimicking diverticulitis such as a neoplasm.  I doubt that is the case because I did a colonoscopy for him about 3 years ago.  He has GERD without alarm symptoms.  We again discussed famotidine 20 mg once nightly as a very good overnight acid suppressing medicine.   Please see the "Patient Instructions" section for addition details about the  plan.   [name], MD [facility] 10/01/2021, 8:05 AM  Cc: Berkley Harvey, NP  Total time on date of encounter was 45 minutes (this included time spent preparing to see the patient reviewing records; obtaining and/or reviewing separately obtained history; performing a medically appropriate exam and/or evaluation; counseling and educating the patient and family if present; ordering medications, tests or procedures if applicable; and documenting clinical information in the health record).

## 2021-10-01 NOTE — Patient Instructions (Signed)
If you are age 53 or older, your body mass index should be between 23-30. Your Body mass index is 36.04 kg/m. If this is out of the aforementioned range listed, please consider follow up with your Primary Care Provider.  If you are age 38 or younger, your body mass index should be between 19-25. Your Body mass index is 36.04 kg/m. If this is out of the aformentioned range listed, please consider follow up with your Primary Care Provider.   ________________________________________________________  The [facility] providers would like to encourage you to use [facility] to communicate with providers for non-urgent requests or questions.  Due to long hold times on the telephone, sending your provider a message by [facility] may be a faster and more efficient way to get a response.  Please allow 48 business hours for a response.  Please remember that this is for non-urgent requests.  _______________________________________________________  Roberto Ruiz have been scheduled for a colonoscopy. Please follow written instructions given to you at your visit today.  Please pick up your prep supplies at the pharmacy within the next 1-3 days. If you use inhalers (even only as needed), please bring them with you on the day of your procedure.  Due to recent changes in healthcare laws, you may see the results of your imaging and laboratory studies on MyChart before your provider has had a chance to review them.  We understand that in some cases there may be results that are confusing or concerning to you. Not all laboratory results come back in the same time frame and the provider may be waiting for multiple results in order to interpret others.  Please give [place] 48 hours in order for your provider to thoroughly review all the results before contacting the office for clarification of your results.   You have been scheduled for an appointment with _________ at [facility]. Your appointment is on ______ at _____. Please  arrive at _____ for registration. Make certain to bring a list of current medications, including any over the counter medications or vitamins. Also bring your co-pay if you have one as well as your insurance cards. [facility] is located at [address], [address]. Should you need to reschedule your appointment, please contact them at [phone].  Thank you for entrusting me with your care and choosing [facility].  Dr Ardis Hughs

## 2021-10-07 ENCOUNTER — Encounter: Payer: Self-pay | Admitting: Gastroenterology

## 2021-10-10 ENCOUNTER — Encounter: Payer: Self-pay | Admitting: Gastroenterology

## 2021-10-10 ENCOUNTER — Ambulatory Visit (AMBULATORY_SURGERY_CENTER): Payer: 59 | Admitting: Gastroenterology

## 2021-10-10 ENCOUNTER — Other Ambulatory Visit: Payer: Self-pay

## 2021-10-10 VITALS — BP 107/77 | HR 53 | Temp 97.8°F | Resp 14 | Ht 68.0 in | Wt 237.0 lb

## 2021-10-10 DIAGNOSIS — K5792 Diverticulitis of intestine, part unspecified, without perforation or abscess without bleeding: Secondary | ICD-10-CM | POA: Diagnosis present

## 2021-10-10 MED ORDER — SODIUM CHLORIDE 0.9 % IV SOLN: 500.0000 mL | Freq: Once | INTRAVENOUS | Status: AC

## 2021-10-10 NOTE — Patient Instructions (Signed)
Handout provided on diverticulosis.   YOU HAD AN ENDOSCOPIC PROCEDURE TODAY AT THE [facility]:   Refer to the procedure report that was given to you for any specific questions about what was found during the examination.  If the procedure report does not answer your questions, please call your gastroenterologist to clarify.  If you requested that your care partner not be given the details of your procedure findings, then the procedure report has been included in a sealed envelope for you to review at your convenience later.  YOU SHOULD EXPECT: Some feelings of bloating in the abdomen. Passage of more gas than usual.  Walking can help get rid of the air that was put into your GI tract during the procedure and reduce the bloating. If you had a lower endoscopy (such as a colonoscopy or flexible sigmoidoscopy) you may notice spotting of blood in your stool or on the toilet paper. If you underwent a bowel prep for your procedure, you may not have a normal bowel movement for a few days.  Please Note:  You might notice some irritation and congestion in your nose or some drainage.  This is from the oxygen used during your procedure.  There is no need for concern and it should clear up in a day or so.  SYMPTOMS TO REPORT IMMEDIATELY:  Following lower endoscopy (colonoscopy or flexible sigmoidoscopy):  Excessive amounts of blood in the stool  Significant tenderness or worsening of abdominal pains  Swelling of the abdomen that is new, acute  Fever of 100F or higher  For urgent or emergent issues, a gastroenterologist can be reached at any hour by calling [phone]. Do not use MyChart messaging for urgent concerns.    DIET:  We do recommend a small meal at first, but then you may proceed to your regular diet.  Drink plenty of fluids but you should avoid alcoholic beverages for 24 hours.  ACTIVITY:  You should plan to take it easy for the rest of today and you should NOT DRIVE or use  heavy machinery until tomorrow (because of the sedation medicines used during the test).    FOLLOW UP: Our staff will call the number listed on your records 48-72 hours following your procedure to check on you and address any questions or concerns that you may have regarding the information given to you following your procedure. If we do not reach you, we will leave a message.  We will attempt to reach you two times.  During this call, we will ask if you have developed any symptoms of COVID 19. If you develop any symptoms (ie: fever, flu-like symptoms, shortness of breath, cough etc.) before then, please call [phone].  If you test positive for Covid 19 in the 2 weeks post procedure, please call and report this information to us.    If any biopsies were taken you will be contacted by phone or by letter within the next 1-3 weeks.  Please call us at [phone] if you have not heard about the biopsies in 3 weeks.    SIGNATURES/CONFIDENTIALITY: You and/or your care partner have signed paperwork which will be entered into your electronic medical record.  These signatures attest to the fact that that the information above on your After Visit Summary has been reviewed and is understood.  Full responsibility of the confidentiality of this discharge information lies with you and/or your care-partner.  

## 2021-10-10 NOTE — Op Note (Signed)
[facility] Patient Name: Roberto Ruiz Procedure Date: 10/10/2021 8:39 AM MRN: [id number] Endoscopist: Milus Banister , MD Age: 55 Referring MD:  Date of Birth: 23-Aug-1966 Gender: Male Account #: [id number] Procedure:                Colonoscopy Indications:              Abnormal CT of the GI tract, recurrent                            diverticulitis; Colonoscopy December 2019 Dr.                            Ardis Hughs for routine risk screening found                            diverticulosis in the left colon. The examination                            was otherwise normal. No polyps or cancers. Medicines:                Monitored Anesthesia Care Procedure:                Pre-Anesthesia Assessment:                           - Prior to the procedure, a History and Physical                            was performed, and patient medications and                            allergies were reviewed. The patient's tolerance of                            previous anesthesia was also reviewed. The risks                            and benefits of the procedure and the sedation                            options and risks were discussed with the patient.                            All questions were answered, and informed consent                            was obtained. Prior Anticoagulants: The patient has                            taken no previous anticoagulant or antiplatelet                            agents. ASA Grade Assessment: II - A patient with  mild systemic disease. After reviewing the risks                            and benefits, the patient was deemed in                            satisfactory condition to undergo the procedure.                           After obtaining informed consent, the colonoscope                            was passed under direct vision. Throughout the                            procedure, the patient's blood pressure, pulse,  and                            oxygen saturations were monitored continuously. The                            CF [id number] #[id number] was introduced through the anus                            and advanced to the the cecum, identified by                            appendiceal orifice and ileocecal valve. The                            colonoscopy was performed without difficulty. The                            patient tolerated the procedure well. The quality                            of the bowel preparation was good. The ileocecal                            valve, appendiceal orifice, and rectum were                            photographed. Scope In: 8:49:37 AM Scope Out: 9:01:17 AM Scope Withdrawal Time: 0 hours 8 minutes 36 seconds  Total Procedure Duration: 0 hours 11 minutes 40 seconds  Findings:                 Multiple small and large-mouthed diverticula were                            found in the left colon, associated with                            edema/luminal narrowing/spasticity.  The exam was otherwise without abnormality on                            direct and retroflexion views. Complications:            No immediate complications. Estimated blood loss:                            None. Estimated Blood Loss:     Estimated blood loss: none. Impression:               - Multiple small and large-mouthed diverticula were                            found in the left colon, associated with                            edema/luminal narrowing/spasticity.                           - The examination was otherwise normal on direct                            and retroflexion views.                           - No specimens collected. Recommendation:           - Patient has a contact number available for                            emergencies. The signs and symptoms of potential                            delayed complications were discussed with the                             patient. Return to normal activities tomorrow.                            Written discharge instructions were provided to the                            patient.                           - Resume previous diet.                           - Continue present medications.                           - Repeat colonoscopy in 10 years for screening.                           - Dr. Ardis Hughs' office will confirm your referral to  [facility] to discuss recurrent diverticulitis Milus Banister, MD 10/10/2021 9:05:31 AM This report has been signed electronically.

## 2021-10-10 NOTE — Progress Notes (Signed)
To PACU, VSS. Report to [email or web address] 

## 2021-10-10 NOTE — Progress Notes (Signed)
  The recent H&P (dated 10/01/2021) was reviewed, the patient was examined and there is no change in the patients condition since that H&P was completed.   Roberto Ruiz  10/10/2021, 8:43 AM

## 2021-10-14 ENCOUNTER — Telehealth: Payer: Self-pay

## 2021-10-14 NOTE — Telephone Encounter (Signed)
  Follow up Call-  Call back number 10/10/2021  Post procedure Call Back phone  # [phone]  Permission to leave phone message Yes  Some recent data might be hidden     Patient questions:  Do you have a fever, pain , or abdominal swelling? No. Pain Score  0 *  Have you tolerated food without any problems? Yes.    Have you been able to return to your normal activities? Yes.    Do you have any questions about your discharge instructions: Diet   No. Medications  No. Follow up visit  No.  Do you have questions or concerns about your Care? No.  Actions: * If pain score is 4 or above: No action needed, pain <4.

## 2021-10-16 ENCOUNTER — Telehealth: Payer: Self-pay | Admitting: Gastroenterology

## 2021-10-16 NOTE — Telephone Encounter (Signed)
The pt has been advised that he can speak with the schedulers at [facility] and make an appt with the MD that he feels comfortable with.  He was also advised that he will discuss if surgery is needed with the surgeon and the type if needed.  The pt has been advised of the information and verbalized understanding.

## 2021-10-16 NOTE — Telephone Encounter (Signed)
Patient called requesting a different referral to another surgeon at [facility].  The doctor he was referred to has "bad reviews" and he doesn't want to see him (plus there's a woman over there that also has bad reviews and he doesn't want to see her either).  He also wants to know the name of the procedure he's being sent there for.  If you could please call him and let him know these things.  Thank you.

## 2021-10-20 DIAGNOSIS — G8929 Other chronic pain: Secondary | ICD-10-CM | POA: Insufficient documentation

## 2021-10-21 NOTE — Telephone Encounter (Signed)
Error   Dr Ardis Hughs ok to refer to Dr Drue Flirt

## 2021-10-21 NOTE — Telephone Encounter (Signed)
Patients wife called requesting another referral to be sent to Dr. Renelda Mom for a second opinion. Also stated the patient is scheduled with [facility] for 10/29/21.

## 2021-10-21 NOTE — Telephone Encounter (Signed)
Dr Rush Landmark is it ok to refer to Dr Drue Flirt?

## 2021-10-22 NOTE — Telephone Encounter (Signed)
A referral has been faxed to Dr Drue Flirt.  The pt has been advised to call with any further questions or concerns

## 2021-10-29 DIAGNOSIS — Z872 Personal history of diseases of the skin and subcutaneous tissue: Secondary | ICD-10-CM | POA: Insufficient documentation

## 2022-06-11 ENCOUNTER — Other Ambulatory Visit: Payer: Self-pay | Admitting: Podiatry

## 2022-06-11 ENCOUNTER — Ambulatory Visit (INDEPENDENT_AMBULATORY_CARE_PROVIDER_SITE_OTHER): Payer: 59

## 2022-06-11 ENCOUNTER — Encounter: Payer: Self-pay | Admitting: Podiatry

## 2022-06-11 ENCOUNTER — Ambulatory Visit (INDEPENDENT_AMBULATORY_CARE_PROVIDER_SITE_OTHER): Payer: 59 | Admitting: Podiatry

## 2022-06-11 DIAGNOSIS — M722 Plantar fascial fibromatosis: Secondary | ICD-10-CM | POA: Diagnosis not present

## 2022-06-11 DIAGNOSIS — M7671 Peroneal tendinitis, right leg: Secondary | ICD-10-CM

## 2022-06-11 DIAGNOSIS — M7751 Other enthesopathy of right foot: Secondary | ICD-10-CM | POA: Diagnosis not present

## 2022-06-11 DIAGNOSIS — M778 Other enthesopathies, not elsewhere classified: Secondary | ICD-10-CM

## 2022-06-11 DIAGNOSIS — M7752 Other enthesopathy of left foot: Secondary | ICD-10-CM | POA: Diagnosis not present

## 2022-06-11 MED ORDER — MELOXICAM 15 MG PO TABS: 15.0000 mg | ORAL_TABLET | Freq: Every day | ORAL | 3 refills | Status: AC

## 2022-06-11 MED ORDER — TRIAMCINOLONE ACETONIDE 40 MG/ML IJ SUSP
40.0000 mg | Freq: Once | INTRAMUSCULAR | Status: AC
Start: 2022-06-11 — End: 2022-06-11
  Administered 2022-06-11: 40 mg

## 2022-06-11 MED ORDER — METHYLPREDNISOLONE 4 MG PO TBPK: ORAL_TABLET | ORAL | 0 refills | Status: DC

## 2022-06-13 NOTE — Progress Notes (Signed)
He presents today 56 year old male with a chief concern of pain to the lateral posterior aspect of the legs.  He states that the left one hurts right and here as he refers to the subtalar joint area of his left foot and the right foot the plantar fascial area.  Objective: Vital signs are stable alert oriented x3 pulses are palpable.  He has pain on palpation of the subtalar joint left with pain on end range of motion of the subtalar joint including some mild tenderness on the lateral fibers of the Achilles as well also has the similar sensation regarding the Achilles on the right foot but does demonstrate Planter fasciitis with pain on palpation to that area on the right side.  Assessment: Subtalar joint capsulitis left Planter fasciitis right cannot rule out planter fasciitis on the left side that started the subtalar joint capsulitis.  Plan: Discussed etiology pathology and surgical therapies I injected subtalar joint left today Planter fasciitis right today with 20 mg Kenalog 5 mg Marcaine point of maximal tenderness.  Started him on methylprednisolone to be followed by meloxicam.  We once again discussed appropriate shoe gear stretching exercise ice therapy shoe modifications and I will follow-up with him in a month or so if needed.

## 2022-09-17 ENCOUNTER — Encounter: Payer: Self-pay | Admitting: Podiatry

## 2022-09-17 ENCOUNTER — Ambulatory Visit (INDEPENDENT_AMBULATORY_CARE_PROVIDER_SITE_OTHER): Payer: 59 | Admitting: Podiatry

## 2022-09-17 DIAGNOSIS — M722 Plantar fascial fibromatosis: Secondary | ICD-10-CM

## 2022-09-17 MED ORDER — TRIAMCINOLONE ACETONIDE 40 MG/ML IJ SUSP
20.0000 mg | Freq: Once | INTRAMUSCULAR | Status: AC
  Administered 2022-09-17: 20 mg

## 2022-09-18 NOTE — Progress Notes (Signed)
He presents today states that he is having pain that in his right heel that is radiating up the lateral aspect of his foot and into the back of his leg.  Objective: Vital signs are stable he is alert oriented x3 has pain on palpation medial calcaneal tubercle of the right heel.  He also has some mild tendinitis of the Achilles with tenderness and fluctuance on palpation.  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  Plan: Discussed etiology pathology and surgical therapies I injected the area today for plantar fascia 20 mg Kenalog 5 mg Marcaine.  I scheduled him for a set of orthotics to be molded.  I will follow-up with him in the near future.

## 2022-10-05 ENCOUNTER — Ambulatory Visit (INDEPENDENT_AMBULATORY_CARE_PROVIDER_SITE_OTHER): Payer: 59 | Admitting: *Deleted

## 2022-10-05 DIAGNOSIS — M722 Plantar fascial fibromatosis: Secondary | ICD-10-CM | POA: Diagnosis not present

## 2022-10-05 NOTE — Progress Notes (Signed)
Patient presents today to be casted for custom molded orthotics. Dr. Milinda Pointer has been treating patient for plantar fasciitis.   Impression foam cast was taken. ABN signed.  Patient info-  Shoe size: 10 4 E  Shoe style: athletic  Weight: 225 lbs  Insurance: [employer]   Patient will be notified once orthotics arrive in office and reappoint for fitting at that time.

## 2022-11-11 ENCOUNTER — Telehealth: Payer: Self-pay | Admitting: Podiatry

## 2022-11-11 NOTE — Telephone Encounter (Signed)
Patient wife called about orthotics, she is upset because he was told that his insurance would cover them because they are medically necessary so he ordered them,not knowing their plan does not cover orthotics.  They would not have ordered them if they knew that their plan did not cover them.  She wants to know if there is a cheaper alternative that they can get.   I explained to her once they are made that the patient is responsible for them because they are custom and we can't send them back for a refund.   She wants to know if there is any way that they can get a discount on them?

## 2022-11-13 NOTE — Telephone Encounter (Signed)
Spoke with the wife Anderson Malta this afternoon, she said to thank  you for adjusting the price, she will have him pay the $245 when he comes in Monday to pick them up

## 2022-11-16 ENCOUNTER — Other Ambulatory Visit: Payer: 59

## 2022-11-27 ENCOUNTER — Encounter: Payer: Self-pay | Admitting: *Deleted

## 2022-11-27 ENCOUNTER — Other Ambulatory Visit: Payer: 59

## 2022-11-27 NOTE — Progress Notes (Unsigned)
Patient presents today to pick up custom molded foot orthotics recommended by Dr. Milinda Pointer.   Orthotics were dispensed and fit was satisfactory. Reviewed instructions for break-in and wear. Written instructions given to patient.  Patient will follow up as needed.   [facility] - order # [id number]

## 2022-12-14 ENCOUNTER — Ambulatory Visit (INDEPENDENT_AMBULATORY_CARE_PROVIDER_SITE_OTHER): Payer: 59 | Admitting: Neurology

## 2022-12-14 ENCOUNTER — Encounter: Payer: Self-pay | Admitting: Neurology

## 2022-12-14 VITALS — BP 111/76 | HR 58 | Ht 68.0 in | Wt 239.8 lb

## 2022-12-14 DIAGNOSIS — G4700 Insomnia, unspecified: Secondary | ICD-10-CM | POA: Diagnosis not present

## 2022-12-14 DIAGNOSIS — R0683 Snoring: Secondary | ICD-10-CM | POA: Diagnosis not present

## 2022-12-14 DIAGNOSIS — E669 Obesity, unspecified: Secondary | ICD-10-CM

## 2022-12-14 DIAGNOSIS — Z9189 Other specified personal risk factors, not elsewhere classified: Secondary | ICD-10-CM | POA: Diagnosis not present

## 2022-12-14 NOTE — Patient Instructions (Signed)

## 2022-12-14 NOTE — Progress Notes (Signed)
Subjective:    Patient ID: Roberto Ruiz is a 57 y.o. male.  HPI    Star Age, MD, PhD [facility] [address], [address]O. [place], [place] [address]  Dear Kieth Brightly,  I saw your patient, Roberto Ruiz, upon your kind request in my sleep clinic today for initial consultation of his sleep disorder, in particular, concern for underlying obstructive sleep apnea.  The patient is unaccompanied today.  As you know, [name] is a 58 year old male with an underlying medical history of testicular cancer, arthritis with status post right knee surgery, reflux disease, anxiety, depression, and obesity, who reports some snoring and chronic difficulty maintaining sleep for years.  He has some daytime somnolence depending on his sleep and work schedule and travel schedule.  He has tried melatonin which was not consistently helpful and hydroxyzine at 10 mg strength made him groggy the next day, he has not tried lowering to half a pill.  His Epworth sleepiness score is 5 out of 24, fatigue severity score is 38 out of 63.  I reviewed your office note from 10/07/2022.  He takes methocarbamol and Flexeril as needed for back pain as well as Mobic as needed.  He takes hydroxyzine very infrequently as it made him drowsy during the day.  Bedtime is generally between 9:30 PM and 10 PM and rise time between 4 and 5 AM.  He generally starts work at 6.  He works as a Nurse, learning disability for [employer].  He travels quite a bit by road and air travel across the country typically.  He lives with his wife, they have 2 grown children, a small dog in the household, the dog does sleep on the bed with them.  That he have a TV in the bedroom but it is typically not on at night.  He does not have much in the way of difficulty falling asleep.  He had a tonsillectomy and adenoidectomy as a child.  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  He does not typically wake up gasping.  His wife has sleep apnea and he is familiar with CPAP therapy.  He is a non-smoker and drinks alcohol very occasionally, a few times a year, he drinks caffeine in the form of coffee, typically 2 cups in the morning.  His Past Medical History Is Significant For: Past Medical History:  Diagnosis Date   Cancer [facility]) 2013   testicular   Cataract    Diverticulitis    GERD (gastroesophageal reflux disease)    with Chemo   Wears contact lenses     His Past Surgical History Is Significant For: Past Surgical History:  Procedure Laterality Date   HERNIA REPAIR     ORCHIECTOMY Left 11/13/2013   Procedure: LEFT RADICAL INGUINAL ORCHIECTOMY;  Surgeon: Molli Hazard, MD;  Location: [facility];  Service: Urology;  Laterality: Left;   right arthroscopy w/ open removal of reputured bursa  1987   TENDON REPAIR Right    foot   TESTICLE REMOVAL  2013   TONSILLECTOMY AND ADENOIDECTOMY  1990    His Family History Is Significant For: Family History  Problem Relation Age of Onset   Depression Father    Prostate cancer Father    Depression Sister    Anxiety disorder Sister    Anxiety disorder Maternal Grandfather    Colon cancer Neg Hx  Colon polyps Neg Hx    Esophageal cancer Neg Hx    Rectal cancer Neg Hx    Stomach cancer Neg Hx     His Social History Is Significant For: Social History   Socioeconomic History   Marital status: Married    Spouse name: Not on file   Number of children: Not on file   Years of education: Not on file   Highest education level: Not on file  Occupational History   Not on file  Tobacco Use   Smoking status: Never   Smokeless tobacco: Never  Vaping Use   Vaping Use: Never used  Substance and Sexual Activity   Alcohol use: Yes    Comment: rare 5-6 beers per year   Drug use: No   Sexual activity: Not on file  Other Topics Concern   Not on file  Social History  Narrative   Caffiene 2 cups coffee, daily.  Lives home with spouse Anderson [place]   Works for [employer], 10-15 hours daily   Children 2   Social Determinants of Health   Financial Resource Strain: Not on file  Food Insecurity: Not on file  Transportation Needs: Not on file  Physical Activity: Not on file  Stress: Not on file  Social Connections: Not on file    His Allergies Are:  Allergies  Allergen Reactions   Compazine [Prochlorperazine Edisylate] Other (See Comments)    Caused severe shaking and leg cramps Caused sever shaking and leg cramps Caused severe shaking and leg cramps Caused sever shaking and leg cramps    Escitalopram Other (See Comments)    States it made him feel angry.   Sulfa Antibiotics Hives and Other (See Comments)   Sulfasalazine Hives  :   His Current Medications Are:  Outpatient Encounter Medications as of 12/14/2022  Medication Sig   clindamycin (CLEOCIN T) 1 % external solution Apply topically daily.   cyclobenzaprine (FLEXERIL) 5 MG tablet Take 1 tablet by mouth 3 (three) times daily as needed.   DTx App - Sleep (SLEEPIO) MISC    escitalopram (LEXAPRO) 10 MG tablet Take 10 mg by mouth daily.   meloxicam (MOBIC) 15 MG tablet Take 1 tablet (15 mg total) by mouth daily.   methocarbamol (ROBAXIN) 500 MG tablet Take 500 mg by mouth every 6 (six) hours as needed.   Multiple Vitamin (MULTIVITAMIN) tablet Take 1 tablet by mouth daily.   [DISCONTINUED] methylPREDNISolone (MEDROL DOSEPAK) 4 MG TBPK tablet 6 day dose pack - take as directed   Facility-Administered Encounter Medications as of 12/14/2022  Medication   0.9 %  sodium chloride infusion  :   Review of Systems:  Out of a complete 14 point review of systems, all are reviewed and negative with the exception of these symptoms as listed below:  Review of Systems  Neurological:        Snoring, not sleeping well.  ESS  5 FSS 38.    Objective:  Neurological Exam  Physical Exam Physical Examination:    Vitals:   12/14/22 0844  BP: 111/76  Pulse: (!) 58    General Examination: The patient is a very pleasant 57 y.o. male in no acute distress. He appears well-developed and well-nourished and well groomed.   HEENT: Normocephalic, atraumatic, pupils are equal, round and reactive to light, extraocular tracking is good without limitation to gaze excursion or nystagmus noted. Hearing is grossly intact. Face is symmetric with normal facial animation. Speech is clear with no dysarthria noted. There  is no hypophonia. There is no lip, neck/head, jaw or voice tremor. Neck is supple with full range of passive and active motion. There are no carotid bruits on auscultation. Oropharynx exam reveals: No significant mouth dryness, good dental hygiene, small airway, smaller mouth opening, Mallampati class I, tonsils absent.  Tongue protrudes centrally and palate elevates symmetrically.  Neck circumference 17 1/8 inches.  Minimal overbite noted.   Chest: Clear to auscultation without wheezing, rhonchi or crackles noted.  Heart: S1+S2+0, regular and normal without murmurs, rubs or gallops noted.   Abdomen: Soft, non-tender and non-distended.  Extremities: There is no pitting edema in the distal lower extremities bilaterally.   Skin: Warm and dry without trophic changes noted.   Musculoskeletal: exam reveals no obvious joint deformities.   Neurologically:  Mental status: The patient is awake, alert and oriented in all 4 spheres. His immediate and remote memory, attention, language skills and fund of knowledge are appropriate. There is no evidence of aphasia, agnosia, apraxia or anomia. Speech is clear with normal prosody and enunciation. Thought process is linear. Mood is normal and affect is normal.  Cranial nerves II - XII are as described above under HEENT exam.  Motor exam: Normal bulk, strength and tone is noted. There is no obvious action or resting tremor.  Fine motor skills and coordination: grossly  intact.  Cerebellar testing: No dysmetria or intention tremor. There is no truncal or gait ataxia.  Sensory exam: intact to light touch in the upper and lower extremities.  Gait, station and balance: He stands easily. No veering to one side is noted. No leaning to one side is noted. Posture is age-appropriate and stance is narrow based. Gait shows normal stride length and normal pace. No problems turning are noted.   Assessment and Plan:  In summary, BURNARD ENIS is a very pleasant 57 y.o.-year old male with an underlying medical history of testicular cancer, arthritis with status post right knee surgery, reflux disease, anxiety, depression, and obesity, who presents for evaluation of his chronic sleep disturbance, particularly maintaining sleep.  He has some snoring, and a small airway is noted on examination.  Underlying sleep disordered breathing is within the diagnostic possibilities. A laboratory attended sleep study is typically considered "gold standard" for evaluation of sleep disordered breathing.   I had a long chat with the patient about my findings and the diagnosis of sleep apnea, particularly OSA, its prognosis and treatment options. We talked about medical/conservative treatments, surgical interventions and non-pharmacological approaches for symptom control. I explained, in particular, the risks and ramifications of untreated moderate to severe OSA, especially with respect to developing cardiovascular disease down the road, including congestive heart failure (CHF), difficult to treat hypertension, cardiac arrhythmias (particularly A-fib), neurovascular complications including TIA, stroke and dementia. Even type 2 diabetes has, in part, been linked to untreated OSA. Symptoms of untreated OSA may include (but may not be limited to) daytime sleepiness, nocturia (i.e. frequent nighttime urination), memory problems, mood irritability and suboptimally controlled or worsening mood disorder such as  depression and/or anxiety, lack of energy, lack of motivation, physical discomfort, as well as recurrent headaches, especially morning or nocturnal headaches. We talked about the importance of maintaining a healthy lifestyle and striving for healthy weight. In addition, we talked about the importance of striving for and maintaining good sleep hygiene. He is advised to try hydroxyzine at half pill as needed at night. I recommended a sleep study at this time. I outlined the differences between a laboratory  attended sleep study which is considered more comprehensive and accurate over the option of a home sleep test (HST); the latter may lead to underestimation of sleep disordered breathing in some instances and does not help with diagnosing upper airway resistance syndrome and is not accurate enough to diagnose primary central sleep apnea typically. I outlined possible surgical and non-surgical treatment options of OSA, including the use of a positive airway pressure (PAP) device (i.e. CPAP, AutoPAP/APAP or BiPAP in certain circumstances), a custom-made dental device (aka oral appliance, which would require a referral to a specialist dentist or orthodontist typically, and is generally speaking not considered for patients with full dentures or edentulous state), upper airway surgical options, such as traditional UPPP (which is not considered a first-line treatment) or the Inspire device (hypoglossal nerve stimulator, which would involve a referral for consultation with an ENT surgeon, after careful selection, following inclusion criteria - also not first-line treatment). I explained the PAP treatment option to the patient in detail, as this is generally considered first-line treatment.  The patient indicated that he would be willing to try PAP therapy, if the need arises. I explained the importance of being compliant with PAP treatment, not only for insurance purposes but primarily to improve patient's symptoms  symptoms, and for the patient's long term health benefit, including to reduce His cardiovascular risks longer-term.    We will pick up our discussion about the next steps and treatment options after testing.  We will keep him posted as to the test results by phone call and/or MyChart messaging where possible.  We will plan to follow-up in sleep clinic accordingly as well.  I answered all his questions today and the patient was in agreement.   I encouraged him to call with any interim questions, concerns, problems or updates or email [place] through [place].  Generally speaking, sleep test authorizations may take up to 2 weeks, sometimes less, sometimes longer, the patient is encouraged to get in touch with [place] if they do not hear back from the sleep lab staff directly within the next 2 weeks.  Thank you very much for allowing me to participate in the care of this nice patient. If I can be of any further assistance to you please do not hesitate to call me at [phone].  Sincerely,   Star Age, MD, PhD

## 2022-12-21 ENCOUNTER — Telehealth: Payer: Self-pay | Admitting: Neurology

## 2022-12-21 NOTE — Telephone Encounter (Signed)
[employer] no auth req   HST- left VM 12/17/22 [place]

## 2023-01-04 NOTE — Telephone Encounter (Signed)
Patient called back and I spoke with the patient.  MAIL OUT HST- [employer] no auth req   Mail out is scheduled for 01/06/23.

## 2023-01-06 ENCOUNTER — Ambulatory Visit: Payer: 59 | Admitting: Neurology

## 2023-01-06 DIAGNOSIS — Z9189 Other specified personal risk factors, not elsewhere classified: Secondary | ICD-10-CM

## 2023-01-06 DIAGNOSIS — G4700 Insomnia, unspecified: Secondary | ICD-10-CM

## 2023-01-06 DIAGNOSIS — R0683 Snoring: Secondary | ICD-10-CM

## 2023-01-06 DIAGNOSIS — G4733 Obstructive sleep apnea (adult) (pediatric): Secondary | ICD-10-CM

## 2023-01-06 DIAGNOSIS — E669 Obesity, unspecified: Secondary | ICD-10-CM

## 2023-01-12 NOTE — Procedures (Signed)
   [facility]  HOME SLEEP TEST (Watch PAT) REPORT  STUDY DATE: 01/09/2023  DOB: 11-Oct-1966  MRN: [id number]  ORDERING CLINICIAN: Star Age, MD, PhD   REFERRING CLINICIAN: Berkley Harvey, NP   CLINICAL INFORMATION/HISTORY: 57 year old male with an underlying medical history of testicular cancer, arthritis with status post right knee surgery, reflux disease, anxiety, depression, and obesity, who reports some snoring and chronic difficulty maintaining sleep for years.     Epworth sleepiness score: 5/24.  BMI: 36.4 kg/m  FINDINGS:   Sleep Summary:   Total Recording Time (hours, min): 9 hours, 59 min  Total Sleep Time (hours, min):  8 hours, 21 min  Percent REM (%):    16.6%   Respiratory Indices:   Calculated pAHI (per hour):  15/hour         REM pAHI:    18.8/hour       NREM pAHI: 14.2/hour  Central pAHI: 1.9/hour  Oxygen Saturation Statistics:    Oxygen Saturation (%) Mean: 94%   Minimum oxygen saturation (%):                 86%   O2 Saturation Range (%): 86 - 98%    O2 Saturation (minutes) <=88%: 0.4 min  Pulse Rate Statistics:   Pulse Mean (bpm):    56/min    Pulse Range (42 - 79/min)   IMPRESSION: OSA (obstructive sleep apnea)   RECOMMENDATION:  This home sleep test demonstrates moderate obstructive sleep apnea with a total AHI of 15/hour and O2 nadir of 86%.  Mild to moderate snoring was detected, rarely in the louder range. Treatment with a positive airway pressure (PAP) device is recommended. The patient will be advised to proceed with an autoPAP titration/trial at home for now. A full night titration study may be considered to optimize treatment settings, monitor proper oxygen saturations and aid with improvement of tolerance and adherence, if needed down the road. Alternative treatment options may include a dental device through dentistry or orthodontics in selected patients or Inspire (hypoglossal nerve stimulator) in carefully  selected patients (meeting inclusion criteria).  Concomitant weight loss is recommended (where clinically appropriate). Please note that untreated obstructive sleep apnea may carry additional perioperative morbidity. Patients with significant obstructive sleep apnea should receive perioperative PAP therapy and the surgeons and particularly the anesthesiologist should be informed of the diagnosis and the severity of the sleep disordered breathing. The patient should be cautioned not to drive, work at heights, or operate dangerous or heavy equipment when tired or sleepy. Review and reiteration of good sleep hygiene measures should be pursued with any patient. Other causes of the patient's symptoms, including circadian rhythm disturbances, an underlying mood disorder, medication effect and/or an underlying medical problem cannot be ruled out based on this test. Clinical correlation is recommended.  The patient and his referring provider will be notified of the test results. The patient will be seen in follow up in sleep clinic at [facility].  I certify that I have reviewed the raw data recording prior to the issuance of this report in accordance with the standards of the American Academy of Sleep Medicine (AASM).  INTERPRETING PHYSICIAN:   Star Age, MD, PhD Medical Director, [facility]) [place], ABPN (Neurology and Sleep)   [facility] [address], [place], [place] [address] [phone]

## 2023-01-12 NOTE — Progress Notes (Signed)
See procedure note.

## 2023-01-12 NOTE — Addendum Note (Signed)
Addended by: Star Age on: 01/12/2023 05:13 PM   Modules accepted: Orders

## 2023-01-14 ENCOUNTER — Telehealth: Payer: Self-pay | Admitting: *Deleted

## 2023-01-14 NOTE — Telephone Encounter (Signed)
I called pt. I advised pt that Dr. Rexene Alberts reviewed their sleep study results and found that pt has moderated OSA. Dr. Rexene Alberts recommends that pt start autopap. I reviewed PAP compliance expectations with the pt. Pt is agreeable to starting an auto-PAP. I advised pt that an order will be sent to a DME, [facility], and they will call the pt within about one week after they file with the pt's insurance. They will show the pt how to use the machine, fit for masks, and troubleshoot the auto-PAP if needed. A follow up appt was made for insurance purposes with Dr. Rexene Alberts on 03-31-2023 at [place]. Pt verbalized understanding to arrive 15 minutes early and bring their auto-PAP. Pt verbalized understanding of results. Pt had no questions at this time but was encouraged to call back if questions arise. I have sent the order to [facility] and have received confirmation that they have received the order.

## 2023-01-14 NOTE — Telephone Encounter (Signed)
Shoffner, Gaylyn Lambert, Elliot Dally, RN; [place], [place]; Learta Codding; Shoffner, Shearon Balo,  I have pulled the order for the cpap and we will have processed.  Thank you     Previous Messages    ----- Message ----- From: Brandon Melnick, RN Sent: 01/14/2023   3:13 PM EST To: Learta Codding; [facility]; * Subject: new autopap user                              Good afternoon,  New autopap user for this pateint.  Roberto Ruiz Male, 57 y.o., 1966-06-12 MRN: [id number] Phone: [phone]   Thank you,  Newman Pies

## 2023-01-14 NOTE — Telephone Encounter (Signed)
-----   Message from Star Age, MD sent at 01/12/2023  5:13 PM EST ----- Patient referred by Eldridge Abrahams, NP, seen by me on 12/14/2022, patient had a HST on 01/09/2023.    Please call and notify the patient that the recent home sleep test showed obstructive sleep apnea in the moderate range. I recommend treatment in the form of autoPAP, which means, that we don't have to bring him in for a sleep study with CPAP, but will let him start using a so called autoPAP machine at home, which is a CPAP-like machine with self-adjusting pressures. We will send the order to a local DME company (of his choice, or as per insurance requirement). The DME representative will fit him with a mask, educate him on how to use the machine, how to put the mask on, etc. I have placed an order in the chart. Please send the order, talk to patient, send report to referring MD. We will need a FU in sleep clinic for 10 weeks post-PAP set up, please arrange that with me or one of our NPs. Also reinforce the need for compliance with treatment. Thanks,   Star Age, MD, PhD [facility])

## 2023-03-31 ENCOUNTER — Ambulatory Visit (INDEPENDENT_AMBULATORY_CARE_PROVIDER_SITE_OTHER): Payer: 59 | Admitting: Neurology

## 2023-03-31 ENCOUNTER — Encounter: Payer: Self-pay | Admitting: Neurology

## 2023-03-31 VITALS — BP 113/75 | HR 58 | Ht 68.0 in | Wt 244.4 lb

## 2023-03-31 DIAGNOSIS — M25562 Pain in left knee: Secondary | ICD-10-CM

## 2023-03-31 DIAGNOSIS — G4733 Obstructive sleep apnea (adult) (pediatric): Secondary | ICD-10-CM | POA: Diagnosis not present

## 2023-03-31 NOTE — Patient Instructions (Signed)
It was nice to see you again today. I am glad to hear, things are going well with your autoPAP therapy. You have adjusted well to treatment with your new machine, and you are compliant with it. You have also fulfilled the insurance-mandated compliance percentage, which is reassuring, so you can get ongoing supplies through your insurance. Please talk to your DME provider about getting replacement supplies on a regular basis. Please be sure to change your filter every month, your mask about every 3 months, hose about every 6 months, humidifier chamber about yearly. Some restrictions are imposed by your insurance carrier with regard to how frequently you can get certain supplies.  Your DME company can provide further details if necessary.   Please continue using your autoPAP regularly. While your insurance requires that you use PAP at least 4 hours each night on 70% of the nights, I recommend, that you not skip any nights and use it throughout the night if you can. Getting used to PAP and staying with the treatment long term does take time and patience and discipline. Untreated obstructive sleep apnea when it is moderate to severe can have an adverse impact on cardiovascular health and raise her risk for heart disease, arrhythmias, hypertension, congestive heart failure, stroke and diabetes. Untreated obstructive sleep apnea causes sleep disruption, nonrestorative sleep, and sleep deprivation. This can have an impact on your day to day functioning and cause daytime sleepiness and impairment of cognitive function, memory loss, mood disturbance, and problems focussing. Using PAP regularly can improve these symptoms.  We can see you in 1 year, you can see one of our nurse practitioners as you are stable.   

## 2023-03-31 NOTE — Progress Notes (Signed)
Subjective:    Patient ID: Roberto Ruiz is a 57 y.o. male.  HPI    Interim history:   [name] is a 57 year old male with an underlying medical history of testicular cancer, arthritis with status post right knee surgery, reflux disease, anxiety, depression, and obesity, who  presents for follow-up consultation of his obstructive sleep apnea after interim testing and starting home AutoPap therapy.  Patient is unaccompanied today.  I first met him at the request of his primary care nurse practitioner on 12/14/2022, at which time he reported chronic difficulty maintaining sleep, snoring, and daytime tiredness.  The patient was advised to proceed with a sleep study.  He had a Home sleep test on 01/09/2023 which showed moderate obstructive sleep apnea with an AHI of 15/h, O2 nadir 86% with mild to moderate snoring detected.  He was advised to proceed with home AutoPap therapy.  His set up date was 01/19/2023, he has a ResMed air sense 11 AutoSet machine.  His DME company is [employer].  Today, 03/31/2023: I reviewed his AutoPap compliance data from 02/27/2023 through 03/28/2023, which is a total of 30 days, during which time he used his machine every night with percent use days greater than 4 hours at 97%, indicating excellent compliance with an average usage of 6 hours and 9 minutes, residual AHI at goal at 2.7/h, leak acceptable with the 95th percentile at 4.5 L/min, average pressure for the 95th percentile at 8.5 cm with a range of 5 to 12 cm with EPR of 3.  He reports doing fairly well, has adjusted to his AutoPap quite well by now.  He is using a large nasal mask, has had some throat dryness but uses the humidifier, has already changed the filter once and his nasal mask insert also.  He reports improvement in his sleep quality, daytime energy and also sleep consolidation.  He is motivated to continue with treatment.  Unfortunately, he has had recent issues with his left knee, he had twisted his knee and fell,  injured his meniscus, he has been taking Robaxin and meloxicam and is supposed to start a steroid Dosepak per orthopedics.  The patient's allergies, current medications, family history, past medical history, past social history, past surgical history and problem list were reviewed and updated as appropriate.   Previously:   12/14/22: (He) reports some snoring and chronic difficulty maintaining sleep for years.  He has some daytime somnolence depending on his sleep and work schedule and travel schedule.  He has tried melatonin which was not consistently helpful and hydroxyzine at 10 mg strength made him groggy the next day, he has not tried lowering to half a pill.  His Epworth sleepiness score is 5 out of 24, fatigue severity score is 38 out of 63.  I reviewed your office note from 10/07/2022.  He takes methocarbamol and Flexeril as needed for back pain as well as Mobic as needed.  He takes hydroxyzine very infrequently as it made him drowsy during the day.  Bedtime is generally between 9:30 PM and 10 PM and rise time between 4 and 5 AM.  He generally starts work at 6.  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  He travels quite a bit by road and air travel across the country typically.  He lives with his wife, they have 2 grown children, a small dog in the household, the dog does sleep on the bed with them.  That he have a TV in the bedroom  but it is typically not on at night.  He does not have much in the way of difficulty falling asleep.  He had a tonsillectomy and adenoidectomy as a child.  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  He does not typically wake up gasping.  His wife has sleep apnea and he is familiar with CPAP therapy.  He is a non-smoker and drinks alcohol very occasionally, a few times a year, he drinks caffeine in the form of coffee, typically 2 cups in the morning.   His Past Medical  History Is Significant For: Past Medical History:  Diagnosis Date   Cancer [facility]) 2013   testicular   Cataract    Diverticulitis    GERD (gastroesophageal reflux disease)    with Chemo   Wears contact lenses     His Past Surgical History Is Significant For: Past Surgical History:  Procedure Laterality Date   HERNIA REPAIR     ORCHIECTOMY Left 11/13/2013   Procedure: LEFT RADICAL INGUINAL ORCHIECTOMY;  Surgeon: Milford Cage, MD;  Location: [facility];  Service: Urology;  Laterality: Left;   right arthroscopy w/ open removal of reputured bursa  1987   TENDON REPAIR Right    foot   TESTICLE REMOVAL  2013   TONSILLECTOMY AND ADENOIDECTOMY  1990    His Family History Is Significant For: Family History  Problem Relation Age of Onset   Depression Father    Prostate cancer Father    Depression Sister    Anxiety disorder Sister    Anxiety disorder Maternal Grandfather    Colon cancer Neg Hx    Colon polyps Neg Hx    Esophageal cancer Neg Hx    Rectal cancer Neg Hx    Stomach cancer Neg Hx    Sleep apnea Neg Hx     His Social History Is Significant For: Social History   Socioeconomic History   Marital status: Married    Spouse name: Not on file   Number of children: Not on file   Years of education: Not on file   Highest education level: Not on file  Occupational History   Not on file  Tobacco Use   Smoking status: Never   Smokeless tobacco: Never  Vaping Use   Vaping Use: Never used  Substance and Sexual Activity   Alcohol use: Yes    Comment: rare 5-6 beers per year   Drug use: No   Sexual activity: Not on file  Other Topics Concern   Not on file  Social History Narrative   Caffiene 2 cups coffee, daily.  Lives home with spouse Victorino Dike   Works for [employer], 10-15 hours daily   Children 2   Social Determinants of Health   Financial Resource Strain: Not on file  Food Insecurity: Not on file  Transportation Needs: Not on file   Physical Activity: Not on file  Stress: Not on file  Social Connections: Not on file    His Allergies Are:  Allergies  Allergen Reactions   Compazine [Prochlorperazine Edisylate] Other (See Comments)    Caused severe shaking and leg cramps Caused sever shaking and leg cramps Caused severe shaking and leg cramps Caused sever shaking and leg cramps    Escitalopram Other (See Comments)    States it made him feel angry.   Sulfa Antibiotics Hives and Other (See Comments)   Sulfasalazine Hives  :  His Current Medications Are:  Outpatient Encounter Medications as of 03/31/2023  Medication Sig   clindamycin (CLEOCIN T) 1 % external solution Apply topically daily.   cyclobenzaprine (FLEXERIL) 5 MG tablet Take 1 tablet by mouth 3 (three) times daily as needed.   DTx App - Sleep (SLEEPIO) MISC    escitalopram (LEXAPRO) 10 MG tablet Take 10 mg by mouth daily.   meloxicam (MOBIC) 15 MG tablet Take 1 tablet (15 mg total) by mouth daily.   methocarbamol (ROBAXIN) 500 MG tablet Take 500 mg by mouth every 6 (six) hours as needed.   Multiple Vitamin (MULTIVITAMIN) tablet Take 1 tablet by mouth daily.   Facility-Administered Encounter Medications as of 03/31/2023  Medication   0.9 %  sodium chloride infusion  :  Review of Systems:  Out of a complete 14 point review of systems, all are reviewed and negative with the exception of these symptoms as listed below:  Review of Systems  Neurological:        Pt here for CPAP f/u Pt states finally getting adjusted  to wearing  pap at night Pt states does wear pap machine 4+ hours nightly    ESS:6    Objective:  Neurological Exam  Physical Exam Physical Examination:   Vitals:   03/31/23 0725  BP: 113/75  Pulse: (!) 58    General Examination: The patient is a very pleasant 57 y.o. male in no acute distress. He appears well-developed and well-nourished and well groomed.   HEENT: Normocephalic, atraumatic, pupils are equal, round and  reactive, tracking well-preserved.  No obvious nystagmus.  Face is symmetric with normal facial animation, speech is clear without dysarthria, hypophonia or voice tremor.  Neck with full range of motion noted, no carotid bruits on auscultation.  Airway examination reveals mild mouth dryness, otherwise stable findings.  Tongue protrudes centrally and palate elevates symmetrically.      Chest: Clear to auscultation without wheezing, rhonchi or crackles noted.   Heart: S1+S2+0, regular and normal without murmurs, rubs or gallops noted.    Abdomen: Soft, non-tender and non-distended.   Extremities: There is no pitting edema in the distal lower extremities bilaterally.    Skin: Warm and dry without trophic changes noted.    Musculoskeletal: exam reveals no obvious joint deformities.    Neurologically:  Mental status: The patient is awake, alert and oriented in all 4 spheres. His immediate and remote memory, attention, language skills and fund of knowledge are appropriate. There is no evidence of aphasia, agnosia, apraxia or anomia. Speech is clear with normal prosody and enunciation. Thought process is linear. Mood is normal and affect is normal.  Cranial nerves II - XII are as described above under HEENT exam.  Motor exam: Normal bulk, moving all 4 extremities without difficulty with the exception of mild limp on the left.  There is no obvious action or resting tremor.  Fine motor skills and coordination: grossly intact.  Cerebellar testing: No dysmetria or intention tremor. There is no truncal or gait ataxia.  Sensory exam: intact to light touch in the upper and lower extremities.  Gait, station and balance: He stands without difficulty, has a slight limp on the left.     Assessment and Plan:  In summary, TALIN MCWHINNIE is a 57 year old male with an underlying medical history of testicular cancer, arthritis with status post right knee surgery, reflux disease, anxiety, depression, and obesity,  who presents for follow-up consultation of his obstructive sleep apnea after interim testing and  starting home AutoPap therapy.  He had a Home sleep test on 01/09/2023 which showed moderate obstructive sleep apnea with an AHI of 15/h, O2 nadir 86% with mild to moderate snoring detected.  He started home AutoPap therapy on 01/19/2023, he has a ResMed air sense 11 AutoSet machine.  His DME company is [employer].  He is compliant with treatment and his apnea control is good, leak from the mask is low, he is able to tolerate his nasal mask.  He endorses improvement in his sleep consolidation, sleep quality and daytime tiredness.  He is highly commended for his treatment adherence.  He is motivated to continue with AutoPap therapy at the current settings.  At this juncture, he is advised to follow-up routinely to see one of our nurse practitioners in 1 year, we can offer him a video visit if he prefers.  He is about to start a steroid Dosepak for his left knee injury.  He is advised to try to take the steroid in the early part of the day to avoid issues with insomnia with taking steroids.  I answered all his questions today and he was in agreement with our plan.  I spent 30 minutes in total face-to-face time and in reviewing records during pre-charting, more than 50% of which was spent in counseling and coordination of care, reviewing test results, reviewing medications and treatment regimen and/or in discussing or reviewing the diagnosis of OSA, the prognosis and treatment options. Pertinent laboratory and imaging test results that were available during this visit with the patient were reviewed by me and considered in my medical decision making (see chart for details).

## 2023-06-10 NOTE — Telephone Encounter (Signed)
 Pt needs 90 day rx for escitalopram sent to [facility].

## 2023-08-02 IMAGING — CT CT ABD-PELV W/ CM
2 of 5 series · 15 of 46 positions shown, 17 images · IV contrast (Omnipaque)
Comparison: CT chest 02/14/2019.

CLINICAL DATA: Left lower quadrant abdominal pain. History of
diverticulitis.

EXAM:
CT ABDOMEN AND PELVIS WITH CONTRAST
TECHNIQUE: Multidetector CT imaging of the abdomen and pelvis was performed
using the standard protocol following bolus administration of
intravenous contrast.
CONTRAST:  100mL OMNIPAQUE IOHEXOL 350 MG/ML SOLN

[Series 2: axial st · axial · 0.91mm/px · z∈[-404,+41]mm · 12 of 101 slices shown, 14 images]
[im 6/101  soft-tissue]
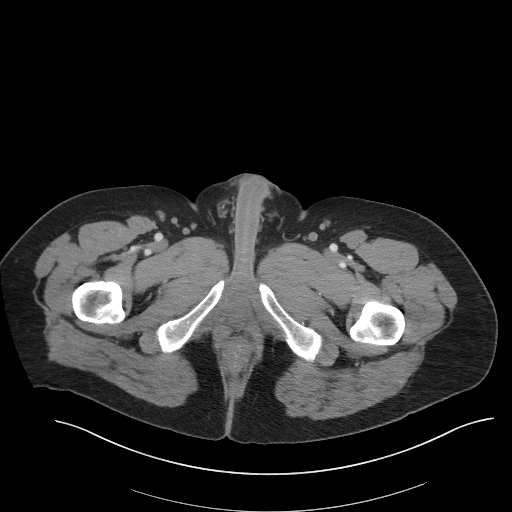
[im 6/101  bone]
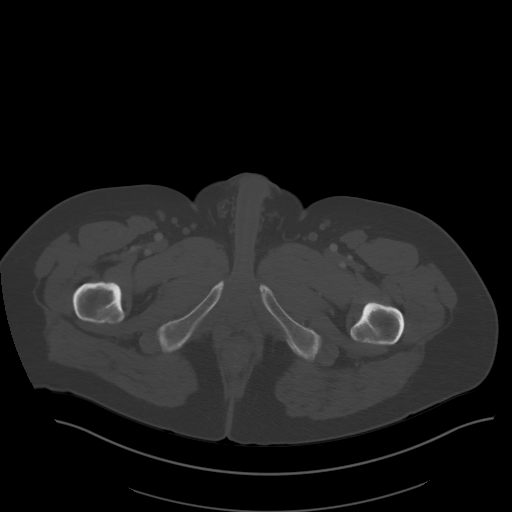
[im 16/101  soft-tissue]
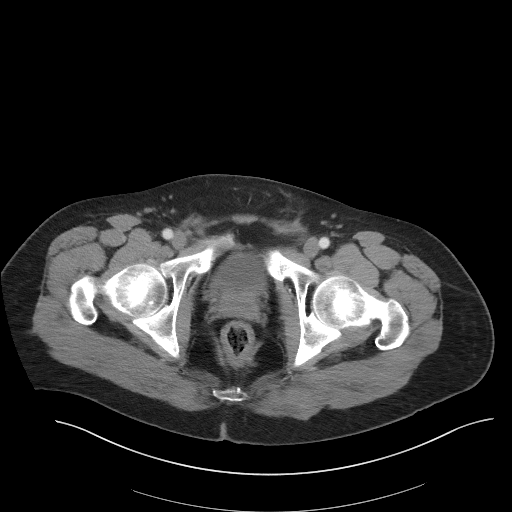
[im 22/101  soft-tissue]
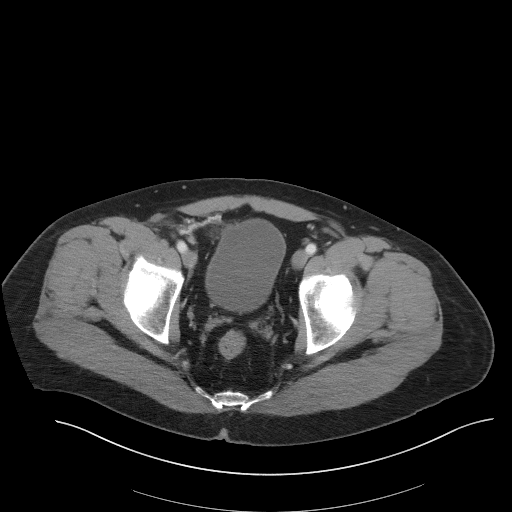
[im 32/101  soft-tissue]
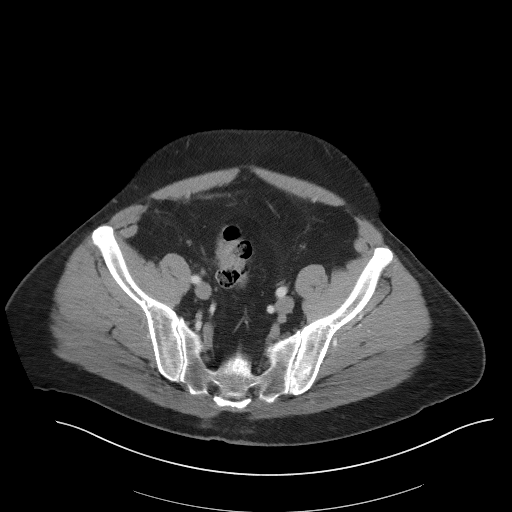
[im 37/101  soft-tissue]
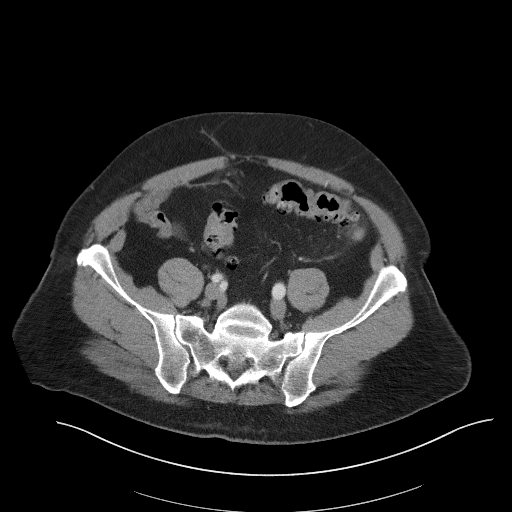
[im 48/101  soft-tissue]
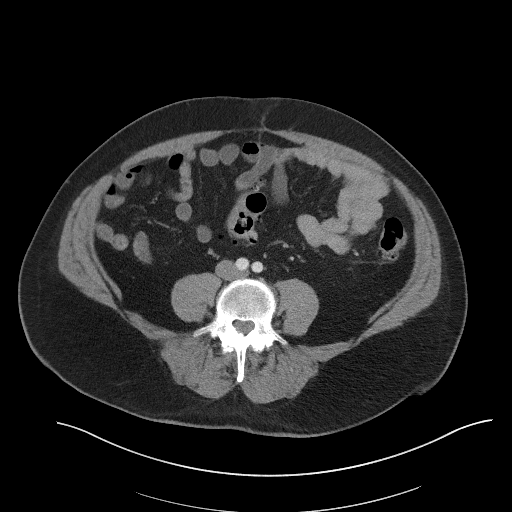
[im 53/101  soft-tissue]
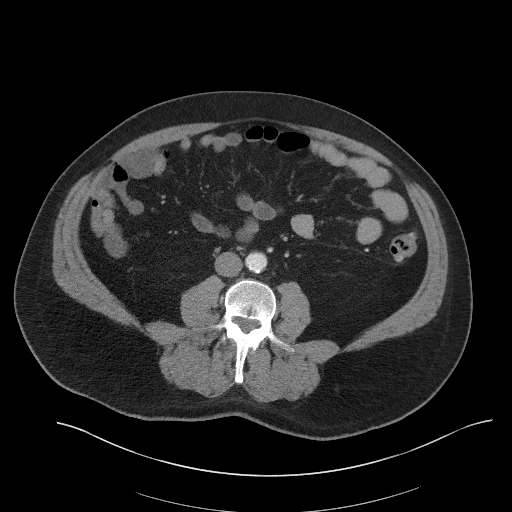
[im 64/101  soft-tissue]
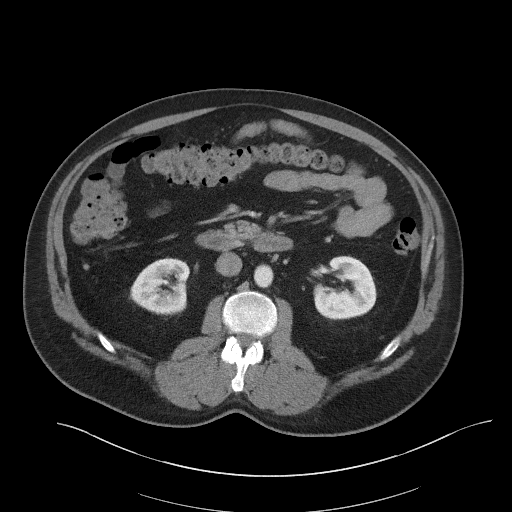
[im 69/101  soft-tissue]
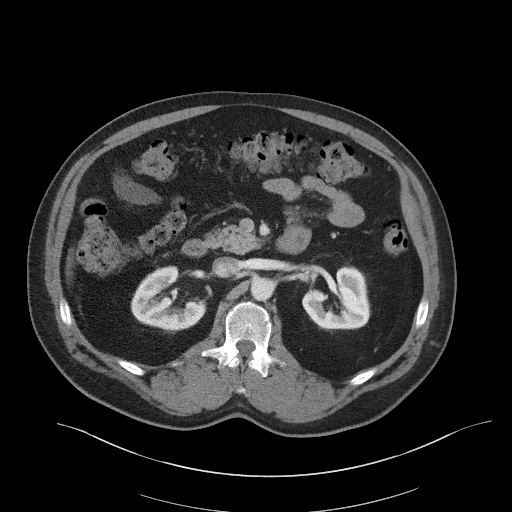
[im 69/101  bone]
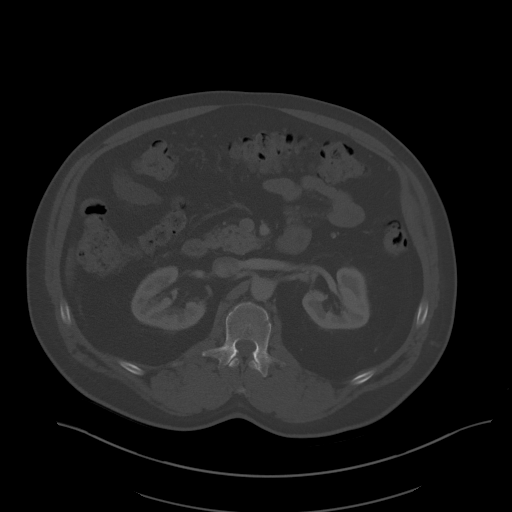
[im 79/101  soft-tissue]
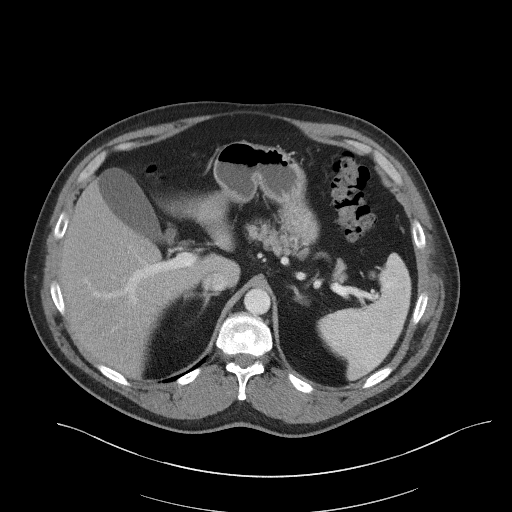
[im 85/101  soft-tissue]
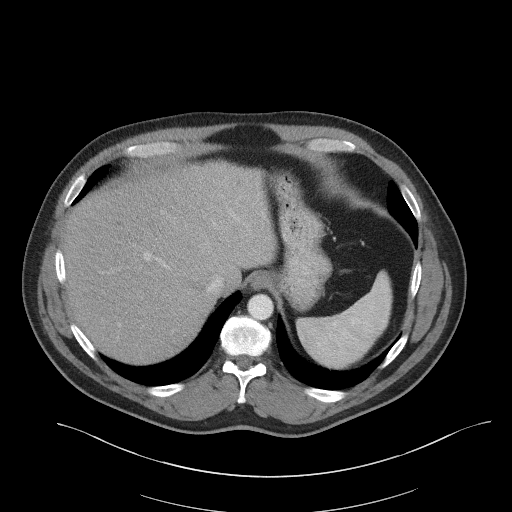
[im 95/101  soft-tissue]
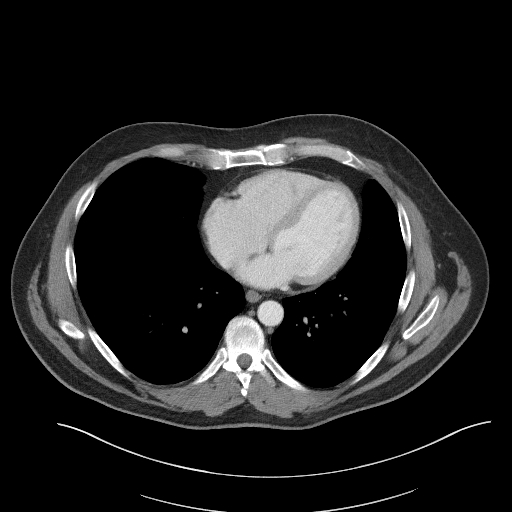

[Series 5: coronal st · coronal · 0.86mm/px · 3 of 109 slices shown]
[im 37/109  soft-tissue]
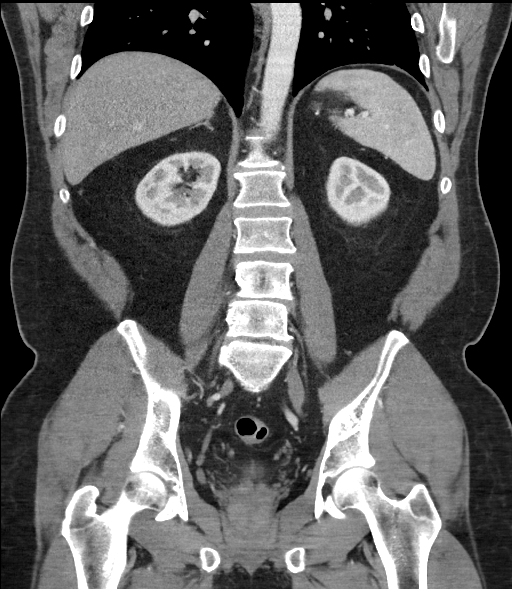
[im 49/109  soft-tissue]
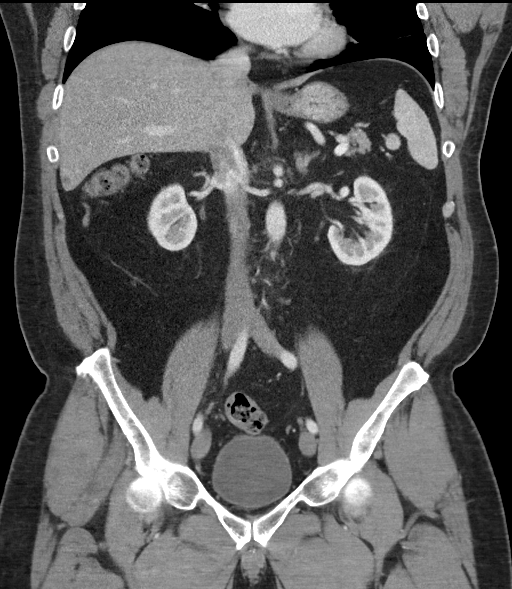
[im 61/109  soft-tissue]
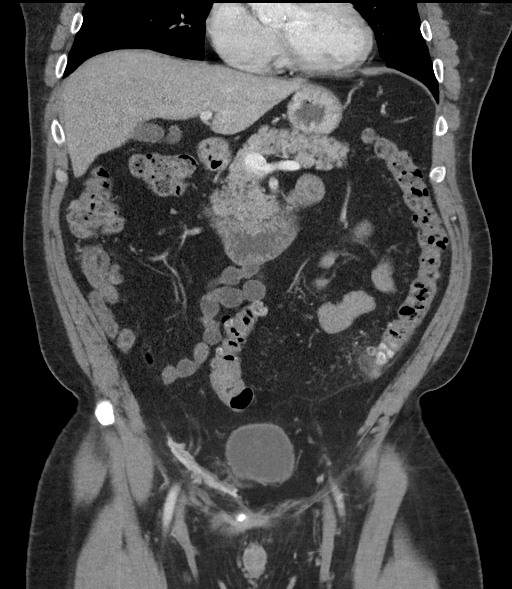

[15 of 46 positions shown; findings below may reference images not displayed]

FINDINGS: Lower chest: Pulmonary micronodule within the right middle lobe.
Otherwise no acute abnormality.

Hepatobiliary: The hepatic parenchyma is diffusely hypodense
compared to the splenic parenchyma consistent with fatty
infiltration. Redemonstration of several subcentimeter hypodensities
are too small to characterize. Otherwise no new focal liver
abnormality. No gallstones, gallbladder wall thickening, or
pericholecystic fluid. No biliary dilatation.

Pancreas: No focal lesion. Normal pancreatic contour. No surrounding
inflammatory changes. No main pancreatic ductal dilatation.

Spleen: Normal in size without focal abnormality. A splenule is
noted.

Adrenals/Urinary Tract:

No adrenal nodule bilaterally.

Bilateral kidneys enhance symmetrically.

No hydronephrosis. No hydroureter.

The urinary bladder is unremarkable.

On delayed imaging, there is no urothelial wall thickening and there
are no filling defects in the opacified portions of the bilateral
collecting systems or ureters.

Stomach/Bowel: Stomach is within normal limits. No evidence of small
bowel wall thickening or dilatation. Scattered colonic
diverticulosis with associated bowel wall thickening and pericolonic
fat stranding along the distal descending colon/proximal sigmoid
colon. Number of diverticula throughout the sigmoid and descending
colon more prominent compared to the remainder of the bowel. No
intramural abscess formation. Appendix appears normal.

Vascular/Lymphatic: No abdominal aorta or iliac aneurysm. Mild
atherosclerotic plaque of the aorta and its branches. No abdominal,
pelvic, or inguinal lymphadenopathy.

Reproductive: Prostate is unremarkable.

Other: No intraperitoneal free fluid. No intraperitoneal free gas.
No organized fluid collection.

Musculoskeletal:

No abdominal wall hernia or abnormality. Status post right inguinal
hernia repair with mesh. No recurrent inguinal hernia identified.

No suspicious lytic or blastic osseous lesions. No acute displaced
fracture. Multilevel facet arthropathy.
IMPRESSION: Colonic diverticulosis with uncomplicated acute diverticulitis of
the distal descending/proximal sigmoid colon. No associated bowel
perforation or abscess formation identified. Recommend colonoscopy
status post treatment and status post complete resolution of
inflammatory changes to exclude an underlying lesion.

## 2023-12-08 NOTE — Telephone Encounter (Signed)
 Roberto Ruiz  Pt said when he had stress test, the nurse told him he may need a carotid doppler, but he has never heard anything  pls call to advise

## 2024-01-13 NOTE — Telephone Encounter (Signed)
 LMOM with results

## 2024-03-28 ENCOUNTER — Ambulatory Visit (INDEPENDENT_AMBULATORY_CARE_PROVIDER_SITE_OTHER): Admitting: Podiatry

## 2024-03-28 ENCOUNTER — Encounter: Payer: Self-pay | Admitting: Podiatry

## 2024-03-28 DIAGNOSIS — M722 Plantar fascial fibromatosis: Secondary | ICD-10-CM | POA: Diagnosis not present

## 2024-03-28 MED ORDER — TRIAMCINOLONE ACETONIDE 40 MG/ML IJ SUSP
20.0000 mg | Freq: Once | INTRAMUSCULAR | Status: AC
  Administered 2024-03-28: 20 mg

## 2024-03-28 MED ORDER — METHYLPREDNISOLONE 4 MG PO TBPK
ORAL_TABLET | ORAL | 0 refills | Status: AC
Start: 2024-03-28 — End: ?

## 2024-03-28 NOTE — Progress Notes (Signed)
 He presents today chief complaint of a flareup of plantar fasciitis to his left heel but not his right.  He states that started about a month ago he is just been working like crazy he has been taken the meloxicam .  Objective: Vital signs are stable oriented x 3 pain on palpation of the medial band plantar band of the lateral band of the plantar fascia.  He has no pain on medial-lateral compression of the calcaneus no pain with direct palpation of the Achilles.  Assessment: Plantar fasciitis left heel.  Plan: I injected his left heel today 20 mg Kenalog  5 mg of Marcaine .  Started him on methylprednisolone  to be followed by meloxicam .

## 2024-03-30 NOTE — Progress Notes (Unsigned)
 Roberto Ruiz

## 2024-04-03 NOTE — Patient Instructions (Signed)

## 2024-04-03 NOTE — Progress Notes (Unsigned)
 PATIENT: Roberto Ruiz DOB: 09/06/1966  REASON FOR VISIT: follow up HISTORY FROM: patient  Virtual Visit via MyChart video  I connected with Roberto Ruiz on 04/04/24 at  9:45 AM EDT via MyChart video and verified that I am speaking with the correct person using two identifiers.   I discussed the limitations, risks, security and privacy concerns of performing an evaluation and management service by Mychart video and the availability of in person appointments. I also discussed with the patient that there may be a patient responsible charge related to this service. The patient expressed understanding and agreed to proceed.   History of Present Illness:  04/04/24 ALL (MyChart): Roberto Ruiz is a 58 y.o. male here today for follow up. For OSA on CPAP. He was last seen by Dr Roberto Ruiz and doing well on therapy. Since, he reports continuing to do well. He is using CPAP most every night for about 5.5 hours, on average. He travels often and admits that he does not always take his machine. He does note significant improvement in sleep quality when using CPAP. He denies concerns with machine or supplies. He reports having an abundance of supplies at this time.     History (copied from Dr Roberto Ruiz previous note)  Roberto Ruiz is a 58 year old male with an underlying medical history of testicular cancer, arthritis with status post right knee surgery, reflux disease, anxiety, depression, and obesity, who  presents for follow-up consultation of his obstructive sleep apnea after interim testing and starting home AutoPap therapy.  Patient is unaccompanied today.  I first met him at the request of his primary care nurse practitioner on 12/14/2022, at which time he reported chronic difficulty maintaining sleep, snoring, and daytime tiredness.  The patient was advised to proceed with a sleep study.  He had a Home sleep test on 01/09/2023 which showed moderate obstructive sleep apnea with an AHI of 15/h, O2 nadir 86% with  mild to moderate snoring detected.  He was advised to proceed with home AutoPap therapy.  His set up date was 01/19/2023, he has a ResMed air sense 11 AutoSet machine.  His DME company is [employer].  Today, 03/31/2023: I reviewed his AutoPap compliance data from 02/27/2023 through 03/28/2023, which is a total of 30 days, during which time he used his machine every night with percent use days greater than 4 hours at 97%, indicating excellent compliance with an average usage of 6 hours and 9 minutes, residual AHI at goal at 2.7/h, leak acceptable with the 95th percentile at 4.5 L/min, average pressure for the 95th percentile at 8.5 cm with a range of 5 to 12 cm with EPR of 3.  He reports doing fairly well, has adjusted to his AutoPap quite well by now.  He is using a large nasal mask, has had some throat dryness but uses the humidifier, has already changed the filter once and his nasal mask insert also.  He reports improvement in his sleep quality, daytime energy and also sleep consolidation.  He is motivated to continue with treatment.  Unfortunately, he has had recent issues with his left knee, he had twisted his knee and fell, injured his meniscus, he has been taking Robaxin and meloxicam  and is supposed to start a steroid Dosepak per orthopedics.   Observations/Objective:  Generalized: Well developed, in no acute distress  Mentation: Alert oriented to time, place, history taking. Follows all commands speech and language fluent   Assessment and Plan:  58 y.o. year old  male  has a past medical history of Cancer (HCC) (2013), Cataract, Diverticulitis, GERD (gastroesophageal reflux disease), and Wears contact lenses. here with    ICD-10-CM   1. OSA on CPAP  G47.33      Roberto Ruiz continues to do well on therapy. Compliance report reveals optimal compliance. He was encouraged to continue using CPAP nightly for at least 4 hours. Supply orders held per his request but he is aware we can send whenever needed.  Discussed consideration of travel machine. Healthy lifestyle habits encouraged. He will follow up in 1 year, sooner if needed.   No orders of the defined types were placed in this encounter.   No orders of the defined types were placed in this encounter.    Follow Up Instructions:  I discussed the assessment and treatment plan with the patient. The patient was provided an opportunity to ask questions and all were answered. The patient agreed with the plan and demonstrated an understanding of the instructions.   The patient was advised to call back or seek an in-person evaluation if the symptoms worsen or if the condition fails to improve as anticipated.  I provided 15 minutes of face-to-face and non face-to-face time during this MyChart video encounter. Patient located at their place of residence. Provider is in the office.    Roberto Seng, NP

## 2024-04-04 ENCOUNTER — Encounter: Payer: Self-pay | Admitting: Family Medicine

## 2024-04-04 ENCOUNTER — Telehealth (INDEPENDENT_AMBULATORY_CARE_PROVIDER_SITE_OTHER): Payer: 59 | Admitting: Family Medicine

## 2024-04-04 DIAGNOSIS — G4733 Obstructive sleep apnea (adult) (pediatric): Secondary | ICD-10-CM | POA: Diagnosis not present

## 2024-04-27 ENCOUNTER — Ambulatory Visit: Admitting: Podiatry

## 2024-07-17 NOTE — Telephone Encounter (Signed)
 Copied from CRM #[id number]. Topic: Medical Rec/Forms - Medical Rec/Forms [place] >> Jul 17, 2024  8:29 AM Delon A wrote: PEGGYANN MADISON EVA A is calling other request    Include all details related to the request(s) below:  Patient is calling is calling to report that [facility] (Dr. Duwayne) has sent in a medical clearance form for upcoming surgery. Patient reports that this forms needs to be reviewed and completed by PCP and returned as soon as possible please. Patient is requesting a call back once this form has been received please.   Confirm and type the Best Contact Number below:  Patient/caller contact number:          [phone]    [] Home  [x] Mobile  [] Work  []  Other   [x]  Okay to leave a voicemail   Medication List:  Current Outpatient Medications:  .  albuterol HFA (PROVENTIL HFA;VENTOLIN HFA;PROAIR HFA) 90 mcg/actuation inhaler, Inhale 2 puffs every 6 (six) hours as needed for wheezing., Disp: 1 each, Rfl: 0 .  aspirin 81 mg EC tablet, Take 81 mg by mouth daily., Disp: , Rfl:  .  benzoyl peroxide 10 % clsr topical solution, , Disp: , Rfl:  .  clindamycin (CLEOCIN T) 1 % solution, , Disp: , Rfl:  .  diclofenac (VOLTAREN) 75 mg EC tablet, Take 75 mg by mouth 2 (two) times a day., Disp: , Rfl:  .  DULoxetine (CYMBALTA) 30 mg capsule, Take 3 capsules (90 mg total) by mouth daily., Disp: 270 capsule, Rfl: 1 .  evolocumab 140 mg/mL pnij, Inject 1 mL (140 mg total) under the skin every 14 (fourteen) days., Disp: 2 mL, Rfl: 6 .  famotidine  (PEPCID ) 40 mg tablet, Take 40 mg by mouth., Disp: , Rfl:  .  meloxicam  (MOBIC ) 15 mg tablet, , Disp: , Rfl:  .  methocarbamoL (ROBAXIN) 500 mg tablet, Take 1 tablet (500 mg total) by mouth 3 (three) times a day as needed for muscle spasms., Disp: 60 tablet, Rfl: 1 .  multivitamin (THERAGRAN) tab tablet, Take 1 tablet by mouth daily., Disp: , Rfl:  .  nitroglycerin (NITROSTAT) 0.4 mg SL tablet, Place 1 tablet (0.4 mg total) under the tongue every 5  (five) minutes as needed for chest pain. Place 1 tablet (0.4 mg total) under the tongue every 5 minutes as needed for Chest Pain. Max 3. If no relief call 911, Disp: 25 tablet, Rfl: 0 .  traZODone (DESYREL) 50 mg tablet, Take 1 tablet (50 mg total) by mouth at bedtime., Disp: 30 tablet, Rfl: 3 .  tretinoin (Retin-A) 0.025 % cream, , Disp: , Rfl:      Medication Request/Refills: Pharmacy Information (if applicable)   []  Not Applicable       []  Pharmacy listed  Send Medication Request to:                                                 []  Pharmacy not listed (added to pharmacy list in Epic) Send Medication Request to:      Listed Pharmacies: [id number] - [place], [place] - [address] AT [facility] [address] - PHONE: [phone] - FAX: [phone]

## 2024-07-17 NOTE — Telephone Encounter (Signed)
 Patient is calling is calling to report that [facility] (Dr. Duwayne) has sent in a medical clearance form for upcoming surgery. Patient reports that this forms needs to be reviewed and completed by PCP and returned as soon as possible please. Patient is requesting a call back once this form has been received please.

## 2024-07-17 NOTE — Telephone Encounter (Signed)
 Patient states [facility] refaxed the form again today  He would like a call back to confirm it was received # [phone]

## 2024-07-21 NOTE — Telephone Encounter (Signed)
 Papers have been completed and picked up

## 2025-04-17 ENCOUNTER — Telehealth: Admitting: Family Medicine
